# Patient Record
Sex: Male | Born: 1970 | Race: White | Hispanic: No | Marital: Single | Smoking: Former smoker
Health system: Southern US, Community
[De-identification: ages and names within clinical notes are randomized; demographics above are authoritative.]

## PROBLEM LIST (undated history)

## (undated) DIAGNOSIS — R42 Dizziness and giddiness: Secondary | ICD-10-CM

## (undated) DIAGNOSIS — J0301 Acute recurrent streptococcal tonsillitis: Secondary | ICD-10-CM

## (undated) DIAGNOSIS — Z8719 Personal history of other diseases of the digestive system: Secondary | ICD-10-CM

## (undated) DIAGNOSIS — Z9889 Other specified postprocedural states: Secondary | ICD-10-CM

## (undated) DIAGNOSIS — F17200 Nicotine dependence, unspecified, uncomplicated: Secondary | ICD-10-CM

## (undated) DIAGNOSIS — Z9089 Acquired absence of other organs: Secondary | ICD-10-CM

## (undated) HISTORY — DX: Personal history of other diseases of the digestive system: Z87.19

## (undated) HISTORY — DX: Nicotine dependence, unspecified, uncomplicated: F17.200

## (undated) HISTORY — DX: Acquired absence of other organs: Z90.89

## (undated) HISTORY — DX: Other specified postprocedural states: Z98.890

## (undated) HISTORY — DX: Dizziness and giddiness: R42

## (undated) HISTORY — DX: Acute recurrent streptococcal tonsillitis: J03.01

---

## 2004-12-15 ENCOUNTER — Emergency Department (HOSPITAL_COMMUNITY): Admission: EM | Admit: 2004-12-15 | Discharge: 2004-12-15 | Payer: Self-pay | Admitting: *Deleted

## 2010-01-05 HISTORY — PX: DENTAL SURGERY: SHX609

## 2011-11-05 ENCOUNTER — Encounter (INDEPENDENT_AMBULATORY_CARE_PROVIDER_SITE_OTHER): Payer: Self-pay | Admitting: General Surgery

## 2011-11-19 ENCOUNTER — Encounter (INDEPENDENT_AMBULATORY_CARE_PROVIDER_SITE_OTHER): Payer: Self-pay | Admitting: General Surgery

## 2011-11-19 ENCOUNTER — Telehealth (INDEPENDENT_AMBULATORY_CARE_PROVIDER_SITE_OTHER): Payer: Self-pay | Admitting: General Surgery

## 2011-11-19 ENCOUNTER — Ambulatory Visit (INDEPENDENT_AMBULATORY_CARE_PROVIDER_SITE_OTHER): Payer: PRIVATE HEALTH INSURANCE | Admitting: General Surgery

## 2011-11-19 VITALS — BP 125/64 | HR 80 | Temp 99.0°F | Resp 14 | Ht 70.0 in | Wt 147.8 lb

## 2011-11-19 DIAGNOSIS — K4090 Unilateral inguinal hernia, without obstruction or gangrene, not specified as recurrent: Secondary | ICD-10-CM

## 2011-11-19 MED ORDER — HYDROCODONE-ACETAMINOPHEN 10-325 MG PO TABS: 1.0000 | ORAL_TABLET | Freq: Every day | ORAL | Status: DC

## 2011-11-19 NOTE — Progress Notes (Signed)
Patient ID: Bryan Fox, male   DOB: February 25, 1970, 41 y.o.   MRN: [id number]  Chief Complaint  Patient presents with  . Hernia    HPI Bryan Fox is a 41 y.o. male.  With a 2 to 3-week history of a left inguinal hernia which occurred while at work.  Since his the patient had burning pain in his left inguinal area which is preventing him from working up with her work. Patient states the pain is greater  when standing for long periods of time and is relieved with the patient is lying down on his back. HPI  Past Medical History  Diagnosis Date  . Vertigo     Past Surgical History  Procedure Date  . Dental surgery 2012    cyst    History reviewed. No pertinent family history.  Social History History  Substance Use Topics  . Smoking status: Former Smoker    Quit date: 11/05/2006  . Smokeless tobacco: Not on file  . Alcohol Use: Yes     Comment: wine 2 per week    No Known Allergies  Current Outpatient Prescriptions  Medication Sig Dispense Refill  . hydrochlorothiazide (HYDRODIURIL) 25 MG tablet Take 25 mg by mouth daily.      Marland Kitchen ibuprofen (ADVIL,MOTRIN) 200 MG tablet Take 200 mg by mouth every 6 (six) hours as needed.      . meclizine (ANTIVERT) 25 MG tablet Take 25 mg by mouth 3 (three) times daily as needed.      . Multiple Vitamin (MULTIVITAMIN WITH MINERALS) TABS Take 1 tablet by mouth daily.      . traMADol (ULTRAM) 50 MG tablet         Review of Systems Review of Systems  Constitutional: Negative.   HENT: Negative.   Eyes: Negative.   Cardiovascular: Negative.   Gastrointestinal: Negative.   Neurological: Negative.     Blood pressure 125/64, pulse 80, temperature 99 F (37.2 C), temperature source Temporal, resp. rate 14, height 5\' 10"  (1.778 m), weight 147 lb 12.8 oz (67.042 kg).  Physical Exam Physical Exam  Constitutional: He is oriented to person, place, and time. He appears well-developed and well-nourished.  HENT:  Head: Normocephalic and  atraumatic.  Eyes: Conjunctivae normal are normal. Pupils are equal, round, and reactive to light.  Neck: Normal range of motion. Neck supple.  Cardiovascular: Normal rate and regular rhythm.   Pulmonary/Chest: Effort normal and breath sounds normal.  Abdominal: Soft. Bowel sounds are normal. Hernia confirmed negative in the left inguinal area.  Musculoskeletal: Normal range of motion.  Neurological: He is alert and oriented to person, place, and time.    Data Reviewed none  Assessment    The patient is a 41 year old male with a left likely direct inguinal hernia.    Plan    1. We'll proceed to the operating room for a laparoscopic left hernia repair. 2. All risks and benefits were discussed with the patient, to generally include infection, bleeding, damage to surrounding structures, and recurrence. Alternatives were offered and described.  All questions were answered and the patient voiced understanding of the procedure and wishes to proceed at this point.        Marigene Ehlers., Ysidra Sopher 11/19/2011, 9:29 AM

## 2011-11-19 NOTE — Telephone Encounter (Signed)
Pt noted that the hydrocodone on his paperwork from OV today is different from that filled at the pharmacy.  [facility] has hydrocodone 10/ 325 mg and his Rx was for 5/ 500 mg.  Pt instructed to use the dosage on the prescription and would let his Dr. Derrell Lolling know of the discrepancy.

## 2011-12-07 DIAGNOSIS — K4090 Unilateral inguinal hernia, without obstruction or gangrene, not specified as recurrent: Secondary | ICD-10-CM

## 2011-12-07 HISTORY — PX: HERNIA REPAIR: SHX51

## 2011-12-10 ENCOUNTER — Telehealth (INDEPENDENT_AMBULATORY_CARE_PROVIDER_SITE_OTHER): Payer: Self-pay | Admitting: General Surgery

## 2011-12-10 NOTE — Telephone Encounter (Signed)
Pt called for refill on pain meds.  Per standing orders, called Hydrocodone 5/325 mg,  # 30, 1-2 po Q 4-6 H prn pain, no refill to [facility]:  [phone].

## 2011-12-11 ENCOUNTER — Telehealth (INDEPENDENT_AMBULATORY_CARE_PROVIDER_SITE_OTHER): Payer: Self-pay

## 2011-12-11 NOTE — Telephone Encounter (Signed)
The pharmacy called back regarding the prescription called in yesterday.  They did not get the prescriber's name.  I told her it is Dr Derrell Lolling.

## 2011-12-22 ENCOUNTER — Encounter (INDEPENDENT_AMBULATORY_CARE_PROVIDER_SITE_OTHER): Payer: Self-pay | Admitting: General Surgery

## 2011-12-22 ENCOUNTER — Ambulatory Visit (INDEPENDENT_AMBULATORY_CARE_PROVIDER_SITE_OTHER): Payer: PRIVATE HEALTH INSURANCE | Admitting: General Surgery

## 2011-12-22 VITALS — BP 112/78 | HR 84 | Temp 97.6°F | Resp 18 | Ht 70.0 in | Wt 151.4 lb

## 2011-12-22 DIAGNOSIS — Z9889 Other specified postprocedural states: Secondary | ICD-10-CM

## 2011-12-22 DIAGNOSIS — Z8719 Personal history of other diseases of the digestive system: Secondary | ICD-10-CM

## 2011-12-22 NOTE — Progress Notes (Signed)
Patient ID: Bryan Fox, male   DOB: Mar 08, 1970, 41 y.o.   MRN: [id number] The patient is a 41 year old male status post left inguinal hernia repair with mesh patient has been doing well postoperatively aside from some soreness in his left testicle. Patient had some bruising which is resolved. Patient slowly started in his activity levels aerobically.  On exam Clean dry and intact There is no bruising to his left testicle There is no hernia on palpation  Assessment and plan: Patient be off work for 2 weeks. Patient will callback one week to reevaluate the possibility of return to work. Patient works as a Airline pilot.  Rx Lortab 5/325 1 or 2 tabs every 4 hours.

## 2012-02-01 ENCOUNTER — Ambulatory Visit (INDEPENDENT_AMBULATORY_CARE_PROVIDER_SITE_OTHER): Payer: PRIVATE HEALTH INSURANCE | Admitting: General Surgery

## 2012-02-01 ENCOUNTER — Encounter (INDEPENDENT_AMBULATORY_CARE_PROVIDER_SITE_OTHER): Payer: Self-pay | Admitting: General Surgery

## 2012-02-01 VITALS — BP 124/72 | HR 80 | Temp 98.2°F | Resp 14 | Ht 70.0 in | Wt 152.8 lb

## 2012-02-01 DIAGNOSIS — Z9889 Other specified postprocedural states: Secondary | ICD-10-CM

## 2012-02-01 NOTE — Progress Notes (Signed)
Patient ID: Bryan Fox, male   DOB: [birth year], 42 y.o.   MRN: [id number] The patient is a 42 year old male status post left inguinal hernia repair with mesh. Patient didn't do well postoperatively and his return back to work. Patient had some soreness but no symptomatology associated with left hernia recurrence.  On exam: The wound is clean dry and intact there is no hernia on palpation.   Assessment and plan: Patient can return to work at full duty Follow up when necessary

## 2015-09-02 ENCOUNTER — Encounter (INDEPENDENT_AMBULATORY_CARE_PROVIDER_SITE_OTHER): Payer: Self-pay | Admitting: *Deleted

## 2015-09-02 VITALS — BP 142/86 | HR 75 | Temp 98.4°F | Ht 70.0 in | Wt 155.0 lb

## 2015-09-02 DIAGNOSIS — Z9089 Acquired absence of other organs: Secondary | ICD-10-CM | POA: Insufficient documentation

## 2015-09-02 DIAGNOSIS — Z006 Encounter for examination for normal comparison and control in clinical research program: Secondary | ICD-10-CM

## 2015-09-02 DIAGNOSIS — Z8719 Personal history of other diseases of the digestive system: Secondary | ICD-10-CM | POA: Insufficient documentation

## 2015-09-02 DIAGNOSIS — F17200 Nicotine dependence, unspecified, uncomplicated: Secondary | ICD-10-CM | POA: Insufficient documentation

## 2015-09-02 DIAGNOSIS — Z720 Tobacco use: Secondary | ICD-10-CM

## 2015-09-02 DIAGNOSIS — J0300 Acute streptococcal tonsillitis, unspecified: Secondary | ICD-10-CM

## 2015-09-02 DIAGNOSIS — Z9889 Other specified postprocedural states: Secondary | ICD-10-CM | POA: Insufficient documentation

## 2015-09-02 DIAGNOSIS — J0301 Acute recurrent streptococcal tonsillitis: Secondary | ICD-10-CM

## 2015-09-02 HISTORY — DX: Acute recurrent streptococcal tonsillitis: J03.01

## 2015-09-02 HISTORY — DX: Nicotine dependence, unspecified, uncomplicated: F17.200

## 2015-09-02 HISTORY — DX: Personal history of other diseases of the digestive system: Z87.19

## 2015-09-02 HISTORY — DX: Acquired absence of other organs: Z90.89

## 2015-09-02 LAB — COMPREHENSIVE METABOLIC PANEL
ALBUMIN: 4.9 g/dL (ref 3.6–5.1)
ALK PHOS: 20 U/L — AB (ref 40–115)
ALT: 22 U/L (ref 9–46)
AST: 23 U/L (ref 10–40)
BILIRUBIN TOTAL: 0.5 mg/dL (ref 0.2–1.2)
BUN: 18 mg/dL (ref 7–25)
CALCIUM: 9.8 mg/dL (ref 8.6–10.3)
CO2: 24 mmol/L (ref 20–31)
Chloride: 102 mmol/L (ref 98–110)
Creat: 0.74 mg/dL (ref 0.60–1.35)
GLUCOSE: 83 mg/dL (ref 65–99)
POTASSIUM: 4 mmol/L (ref 3.5–5.3)
SODIUM: 139 mmol/L (ref 135–146)
Total Protein: 7.3 g/dL (ref 6.1–8.1)

## 2015-09-02 LAB — CBC WITH DIFFERENTIAL/PLATELET
Basophils Absolute: 55 cells/uL (ref 0–200)
Basophils Relative: 1 %
EOS PCT: 3 %
Eosinophils Absolute: 165 cells/uL (ref 15–500)
HCT: 46.1 % (ref 38.5–50.0)
HEMOGLOBIN: 15.2 g/dL (ref 13.2–17.1)
LYMPHS ABS: 2585 {cells}/uL (ref 850–3900)
Lymphocytes Relative: 47 %
MCH: 30 pg (ref 27.0–33.0)
MCHC: 33 g/dL (ref 32.0–36.0)
MCV: 90.9 fL (ref 80.0–100.0)
MPV: 9.9 fL (ref 7.5–12.5)
Monocytes Absolute: 495 cells/uL (ref 200–950)
Monocytes Relative: 9 %
NEUTROS PCT: 40 %
Neutro Abs: 2200 cells/uL (ref 1500–7800)
Platelets: 232 10*3/uL (ref 140–400)
RBC: 5.07 MIL/uL (ref 4.20–5.80)
RDW: 15.7 % — AB (ref 11.0–15.0)
WBC: 5.5 10*3/uL (ref 3.8–10.8)

## 2015-09-02 LAB — HIV ANTIBODY (ROUTINE TESTING W REFLEX): HIV: NONREACTIVE

## 2015-09-02 NOTE — Progress Notes (Addendum)
Study: A Phase 2b/3 Double Blind Safety and Efficacy Study of Injectable Cabotegravir compared to Daily Oral Tenofovir Disoproxil Fumarate/Emtricitabine (TDF/FTC), For Pre-Exposure Prophylaxis in HIV-Uninfected Cisgender Men and Transgender Women who have sex with Men.  Medication: Investigational Injectable Cabotegravir/placebo compared to Truvada/placebo. Duration: Around 4 years.  Bryan Fox is here for [id number] screening visit. After verifying the correct version I explained/reviewed the informed consent in the language that he understood. Risk, benefits, responsibilities, and other options were reviewed. I answered his questions. Comprehension was assessed. He was given adequate time to consider his options. He verbalized understanding and signed the consent witnessed by me. I then gave him a copy of the consent. He is eligible for screening since he has been sexually active with than 5 male at birth partners in the past 6 months and has also used poppers during those ocassions. HIV counseling was given including description of the testing and how it is done; explained HIV and how it is spread and ways to prevent it; Discussed the meaning of the possible test results and what impact the test results may have on the participant. PTID assigned. Confirmation of eligibility for screening was confirmed. Blood drawn and HIV rapid is negative. Medical history, medications, bleeding history, and signs/symptoms were reviewed. ECG and vitals were obtained. Complete PE performed. He received $50 gift card for screening visit. Will come in for HIV VL on 09/16/2015. Tacey HeapElisha Chanel Mcadams RN

## 2015-09-02 NOTE — Progress Notes (Signed)
Subjective:  Chief complaint: Here for CPE for HPTN 083   Patient ID: Bryan Fox, male    DOB: Feb 13, 1970, 45 y.o.   MRN: [id number]  HPI  45 year old Caucasian male who is here for a CPE exam for the PREP study HPTN 083. Bryan Fox heard about the study via friend who knew someone who worked at [employer]. He last tested negative for HIV proximally 6 months ago but has not been tested since then. He has no complaints and had no specific questions about the study though I did go again into details with regards to the drugs involved and how they're being administered.  Past Medical History:  Diagnosis Date  . H/O right inguinal hernia repair 09/02/2015  . Recurrent streptococcal tonsillitis 09/02/2015  . S/P tonsillectomy 09/02/2015  . Smoker 09/02/2015  . Vertigo     Past Surgical History:  Procedure Laterality Date  . DENTAL SURGERY  2012   cyst  . HERNIA REPAIR  12/07/2011   lih repair    Family History  Problem Relation Age of Onset  . Cancer Father     lung      Social History   Social History  . Marital status: Single    Spouse name: N/A  . Number of children: N/A  . Years of education: N/A   Social History Main Topics  . Smoking status: Former Smoker    Quit date: 11/05/2006  . Smokeless tobacco: None  . Alcohol use Yes     Comment: wine 2 per week  . Drug use: No  . Sexual activity: Not Asked   Other Topics Concern  . None   Social History Narrative  . None    No Known Allergies   Current Outpatient Prescriptions:  .  Ascorbic Acid (VITAMIN C) 250 MG CHEW, Chew 2 each by mouth daily., Disp: , Rfl:  .  ibuprofen (ADVIL,MOTRIN) 200 MG tablet, Take 200 mg by mouth every 6 (six) hours as needed., Disp: , Rfl:  .  meclizine (ANTIVERT) 25 MG tablet, Take 25 mg by mouth 3 (three) times daily as needed., Disp: , Rfl:  .  Multiple Vitamin (MULTIVITAMIN WITH MINERALS) TABS, Take 1 tablet by mouth daily., Disp: , Rfl:    Review of Systems    Constitutional: Negative for chills, diaphoresis and fever.  HENT: Negative for congestion, hearing loss, sore throat and tinnitus.   Respiratory: Negative for cough, shortness of breath and wheezing.   Cardiovascular: Negative for chest pain, palpitations and leg swelling.  Gastrointestinal: Negative for abdominal pain, blood in stool, constipation, diarrhea, nausea and vomiting.  Genitourinary: Negative for dysuria, flank pain and hematuria.  Musculoskeletal: Negative for back pain and myalgias.  Skin: Negative for rash.  Neurological: Negative for dizziness, weakness and headaches.  Hematological: Does not bruise/bleed easily.  Psychiatric/Behavioral: Negative for suicidal ideas. The patient is not nervous/anxious.        Objective:   Physical Exam  Constitutional: He is oriented to person, place, and time. He appears well-developed and well-nourished. No distress.  HENT:  Head: Normocephalic and atraumatic.  Right Ear: External ear normal.  Left Ear: External ear normal.  Nose: Nose normal.  Mouth/Throat: Oropharynx is clear and moist. No oropharyngeal exudate.  Eyes: Conjunctivae and EOM are normal. Pupils are equal, round, and reactive to light. Right eye exhibits no discharge. Left eye exhibits no discharge. No scleral icterus.  Neck: Normal range of motion. Neck supple. No JVD present. No tracheal deviation present. No  thyromegaly present.  Cardiovascular: Normal rate, regular rhythm and normal heart sounds.  Exam reveals no gallop and no friction rub.   No murmur heard. Pulmonary/Chest: Effort normal and breath sounds normal. No respiratory distress. He has no wheezes. He has no rales. He exhibits no tenderness.  Abdominal: Bowel sounds are normal. He exhibits no distension and no mass. There is no tenderness. There is no rebound and no guarding.  Musculoskeletal: Normal range of motion. He exhibits no edema, tenderness or deformity.  Lymphadenopathy:       Head (right  side): No submental, no submandibular, no tonsillar, no preauricular, no posterior auricular and no occipital adenopathy present.       Head (left side): No submental, no submandibular, no tonsillar, no preauricular and no occipital adenopathy present.    He has no cervical adenopathy.       Right cervical: No superficial cervical, no deep cervical and no posterior cervical adenopathy present.      Left cervical: No superficial cervical, no deep cervical and no posterior cervical adenopathy present.       Right: No supraclavicular adenopathy present.       Left: No supraclavicular adenopathy present.  Neurological: He is alert and oriented to person, place, and time. No cranial nerve deficit or sensory deficit. He exhibits normal muscle tone. Coordination normal.  Skin: Skin is warm and dry. No rash noted. He is not diaphoretic. No erythema. No pallor.  Psychiatric: He has a normal mood and affect. His behavior is normal. Judgment and thought content normal.          Assessment & Plan:   Normal CPE. His EKG showed Sinus bradycardia. He had J point elevation in V3, V4 c/w his body habitus (lower BMI) and non specific T w inversino in I and AVL. Provided his labs are OK he should be ready to enroll into HPTN 083.

## 2015-09-02 NOTE — Addendum Note (Signed)
Addended by: Nicolasa DuckingEPPERSON, Alanis Clift S on: 09/02/2015 02:35 PM   Modules accepted: Orders

## 2015-09-03 ENCOUNTER — Telehealth: Payer: Self-pay

## 2015-09-03 LAB — HEPATITIS B SURFACE ANTIGEN: HEP B S AG: NEGATIVE

## 2015-09-03 LAB — HEPATITIS C ANTIBODY: HCV AB: NEGATIVE

## 2015-09-16 ENCOUNTER — Encounter (INDEPENDENT_AMBULATORY_CARE_PROVIDER_SITE_OTHER): Payer: Self-pay | Admitting: *Deleted

## 2015-09-16 DIAGNOSIS — Z006 Encounter for examination for normal comparison and control in clinical research program: Secondary | ICD-10-CM

## 2015-09-16 NOTE — Progress Notes (Signed)
[name] is here for second part of screening. He had blood drawn to check HIV quant. Will check for eligibility and if qualifies he will enroll on 9/14. Tacey HeapElisha Epperson RN

## 2015-09-18 LAB — HIV-1 RNA QUANT-NO REFLEX-BLD: HIV 1 RNA Quant: <20 copies/mL (ref <20)

## 2015-09-30 ENCOUNTER — Encounter (INDEPENDENT_AMBULATORY_CARE_PROVIDER_SITE_OTHER): Payer: Self-pay | Admitting: *Deleted

## 2015-09-30 VITALS — BP 138/89 | HR 73 | Temp 98.0°F | Wt 156.1 lb

## 2015-09-30 DIAGNOSIS — Z006 Encounter for examination for normal comparison and control in clinical research program: Secondary | ICD-10-CM

## 2015-09-30 LAB — CBC WITH DIFFERENTIAL/PLATELET
BASOS ABS: 0 {cells}/uL (ref 0–200)
Basophils Relative: 0 %
Eosinophils Absolute: 159 cells/uL (ref 15–500)
Eosinophils Relative: 3 %
HEMATOCRIT: 45 % (ref 38.5–50.0)
HEMOGLOBIN: 14.8 g/dL (ref 13.2–17.1)
LYMPHS PCT: 60 %
Lymphs Abs: 3180 cells/uL (ref 850–3900)
MCH: 30 pg (ref 27.0–33.0)
MCHC: 32.9 g/dL (ref 32.0–36.0)
MCV: 91.1 fL (ref 80.0–100.0)
MONO ABS: 477 {cells}/uL (ref 200–950)
MPV: 9.8 fL (ref 7.5–12.5)
Monocytes Relative: 9 %
NEUTROS PCT: 28 %
Neutro Abs: 1484 cells/uL — ABNORMAL LOW (ref 1500–7800)
Platelets: 225 10*3/uL (ref 140–400)
RBC: 4.94 MIL/uL (ref 4.20–5.80)
RDW: 15.1 % — AB (ref 11.0–15.0)
WBC: 5.3 10*3/uL (ref 3.8–10.8)

## 2015-09-30 LAB — COMPREHENSIVE METABOLIC PANEL
ALBUMIN: 4.4 g/dL (ref 3.6–5.1)
ALT: 18 U/L (ref 9–46)
AST: 18 U/L (ref 10–40)
Alkaline Phosphatase: 17 U/L — ABNORMAL LOW (ref 40–115)
BUN: 17 mg/dL (ref 7–25)
CALCIUM: 9.7 mg/dL (ref 8.6–10.3)
CHLORIDE: 104 mmol/L (ref 98–110)
CO2: 26 mmol/L (ref 20–31)
Creat: 0.73 mg/dL (ref 0.60–1.35)
Glucose, Bld: 103 mg/dL — ABNORMAL HIGH (ref 65–99)
POTASSIUM: 4.5 mmol/L (ref 3.5–5.3)
SODIUM: 139 mmol/L (ref 135–146)
TOTAL PROTEIN: 6.7 g/dL (ref 6.1–8.1)
Total Bilirubin: 0.5 mg/dL (ref 0.2–1.2)

## 2015-09-30 LAB — POCT URINALYSIS DIPSTICK
BILIRUBIN UA: NEGATIVE
Blood, UA: NEGATIVE
GLUCOSE UA: NEGATIVE
KETONES UA: NEGATIVE
LEUKOCYTES UA: NEGATIVE
NITRITE UA: NEGATIVE
Protein, UA: NEGATIVE
Spec Grav, UA: 1.02
Urobilinogen, UA: 0.2
pH, UA: 6

## 2015-09-30 LAB — PHOSPHORUS: PHOSPHORUS: 3.6 mg/dL (ref 2.5–4.5)

## 2015-09-30 LAB — HEPATITIS B CORE ANTIBODY, TOTAL: HEP B C TOTAL AB: NONREACTIVE

## 2015-09-30 LAB — LIPID PANEL
Cholesterol: 181 mg/dL (ref 125–200)
HDL: 91 mg/dL (ref >=40)
LDL CALC: 78 mg/dL (ref <130)
Total CHOL/HDL Ratio: 2 Ratio (ref <=5.0)
Triglycerides: 61 mg/dL (ref <150)
VLDL: 12 mg/dL (ref <30)

## 2015-09-30 LAB — AMYLASE: AMYLASE: 28 U/L (ref 0–105)

## 2015-09-30 LAB — LIPASE: LIPASE: 9 U/L (ref 7–60)

## 2015-09-30 LAB — CK: Total CK: 195 U/L (ref 7–232)

## 2015-09-30 LAB — HIV ANTIBODY (ROUTINE TESTING W REFLEX): HIV: NONREACTIVE

## 2015-09-30 LAB — HEPATITIS B SURFACE ANTIBODY,QUALITATIVE: HEP B S AB: NEGATIVE

## 2015-10-01 ENCOUNTER — Encounter: Payer: Self-pay | Admitting: *Deleted

## 2015-10-01 LAB — GC/CHLAMYDIA PROBE AMP
CT PROBE, AMP APTIMA: NOT DETECTED
GC PROBE AMP APTIMA: NOT DETECTED

## 2015-10-01 LAB — RPR

## 2015-10-01 NOTE — Progress Notes (Signed)
Study: A Phase 2b/3 Double Blind Safety and Efficacy Study of Injectable Cabotegravir compared to Daily Oral Tenofovir Disoproxil Fumarate/Emtricitabine (TDF/FTC), For Pre-Exposure Prophylaxis in HIV-Uninfected Cisgender Men and Transgender Women who have sex with Men.  Medication: Investigational Injectable Cabotegravir/placebo compared to Truvada/placebo. Duration: Around 4 years.   Bryan Fox is here for entry visit. After confirming his willing to enroll I drew his blood. HIV rapid was confirmed to be negative and he was randomized to study. Assessment unchanged since last study visit. Vitals stable. Questionnaires completed. Study meds were dispensed. We discussed proper administration, potential side effects, and my contact information should he have any questions/concerns. He verbalized understanding. He received $50 gift card for visit and will see him in 2 weeks for safety check.

## 2015-10-04 LAB — CT/NG RNA, TMA RECTAL
CHLAMYDIA TRACHOMATIS RNA: NOT DETECTED
NEISSERIA GONORRHOEAE RNA: NOT DETECTED

## 2015-10-14 ENCOUNTER — Encounter (INDEPENDENT_AMBULATORY_CARE_PROVIDER_SITE_OTHER): Payer: Self-pay | Admitting: *Deleted

## 2015-10-14 VITALS — BP 134/89 | HR 69 | Temp 98.0°F | Wt 156.5 lb

## 2015-10-14 DIAGNOSIS — Z006 Encounter for examination for normal comparison and control in clinical research program: Secondary | ICD-10-CM

## 2015-10-14 LAB — COMPREHENSIVE METABOLIC PANEL
ALBUMIN: 4.5 g/dL (ref 3.6–5.1)
ALK PHOS: 18 U/L — AB (ref 40–115)
ALT: 20 U/L (ref 9–46)
AST: 20 U/L (ref 10–40)
BUN: 15 mg/dL (ref 7–25)
CO2: 24 mmol/L (ref 20–31)
CREATININE: 0.64 mg/dL (ref 0.60–1.35)
Calcium: 9.8 mg/dL (ref 8.6–10.3)
Chloride: 102 mmol/L (ref 98–110)
Glucose, Bld: 80 mg/dL (ref 65–99)
POTASSIUM: 4 mmol/L (ref 3.5–5.3)
SODIUM: 138 mmol/L (ref 135–146)
TOTAL PROTEIN: 7.1 g/dL (ref 6.1–8.1)
Total Bilirubin: 0.9 mg/dL (ref 0.2–1.2)

## 2015-10-14 LAB — CBC WITH DIFFERENTIAL/PLATELET
BASOS ABS: 59 {cells}/uL (ref 0–200)
Basophils Relative: 1 %
EOS ABS: 177 {cells}/uL (ref 15–500)
Eosinophils Relative: 3 %
HEMATOCRIT: 43.6 % (ref 38.5–50.0)
HEMOGLOBIN: 14.3 g/dL (ref 13.2–17.1)
LYMPHS ABS: 2655 {cells}/uL (ref 850–3900)
Lymphocytes Relative: 45 %
MCH: 29.9 pg (ref 27.0–33.0)
MCHC: 32.8 g/dL (ref 32.0–36.0)
MCV: 91.2 fL (ref 80.0–100.0)
MONO ABS: 531 {cells}/uL (ref 200–950)
MPV: 9.4 fL (ref 7.5–12.5)
Monocytes Relative: 9 %
NEUTROS PCT: 42 %
Neutro Abs: 2478 cells/uL (ref 1500–7800)
Platelets: 242 10*3/uL (ref 140–400)
RBC: 4.78 MIL/uL (ref 4.20–5.80)
RDW: 14.8 % (ref 11.0–15.0)
WBC: 5.9 10*3/uL (ref 3.8–10.8)

## 2015-10-14 LAB — PHOSPHORUS: PHOSPHORUS: 3 mg/dL (ref 2.5–4.5)

## 2015-10-14 LAB — CK: CK TOTAL: 181 U/L (ref 7–232)

## 2015-10-14 LAB — LIPASE: Lipase: 7 U/L (ref 7–60)

## 2015-10-14 LAB — AMYLASE: Amylase: 24 U/L (ref 0–105)

## 2015-10-14 NOTE — Progress Notes (Signed)
Study: A Phase 2b/3 Double Blind Safety and Efficacy Study of Injectable Cabotegravir compared to Daily Oral Tenofovir Disoproxil Fumarate/Emtricitabine (TDF/FTC), For Pre-Exposure Prophylaxis in HIV-Uninfected Cisgender Men and Transgender Women who have sex with Men.  Medication: Investigational Injectable Cabotegravir/placebo compared to Truvada/placebo. Duration: Around 4 years.  Bryan Fox is here for his week 2 visit. Excellent adherence with his medication. Pill count done, #45 remaining of both medications. Denies any missed doses. States that he did experience mild GI upset (nausea, loose stools) after starting study medication but this has since resolved. Has noted some vivid dreams since starting on his medication as well. No other complaints or concerns verbalized. Next visit scheduled for 10/23 @ 10:30am.

## 2015-10-15 LAB — HIV ANTIBODY (ROUTINE TESTING W REFLEX): HIV: NONREACTIVE

## 2015-10-28 ENCOUNTER — Encounter (INDEPENDENT_AMBULATORY_CARE_PROVIDER_SITE_OTHER): Payer: Self-pay | Admitting: *Deleted

## 2015-10-28 VITALS — BP 126/84 | HR 77 | Temp 97.9°F | Wt 156.0 lb

## 2015-10-28 DIAGNOSIS — Z006 Encounter for examination for normal comparison and control in clinical research program: Secondary | ICD-10-CM

## 2015-10-28 LAB — COMPREHENSIVE METABOLIC PANEL
ALT: 21 U/L (ref 9–46)
AST: 22 U/L (ref 10–40)
Albumin: 4.3 g/dL (ref 3.6–5.1)
Alkaline Phosphatase: 23 U/L — ABNORMAL LOW (ref 40–115)
BUN: 14 mg/dL (ref 7–25)
CHLORIDE: 106 mmol/L (ref 98–110)
CO2: 27 mmol/L (ref 20–31)
CREATININE: 0.72 mg/dL (ref 0.60–1.35)
Calcium: 9.8 mg/dL (ref 8.6–10.3)
Glucose, Bld: 112 mg/dL — ABNORMAL HIGH (ref 65–99)
Potassium: 4.2 mmol/L (ref 3.5–5.3)
SODIUM: 140 mmol/L (ref 135–146)
Total Bilirubin: 0.5 mg/dL (ref 0.2–1.2)
Total Protein: 6.9 g/dL (ref 6.1–8.1)

## 2015-10-28 LAB — CBC WITH DIFFERENTIAL/PLATELET
BASOS PCT: 1 %
Basophils Absolute: 55 cells/uL (ref 0–200)
EOS ABS: 110 {cells}/uL (ref 15–500)
Eosinophils Relative: 2 %
HEMATOCRIT: 44.2 % (ref 38.5–50.0)
HEMOGLOBIN: 14.4 g/dL (ref 13.2–17.1)
LYMPHS ABS: 2420 {cells}/uL (ref 850–3900)
LYMPHS PCT: 44 %
MCH: 30.1 pg (ref 27.0–33.0)
MCHC: 32.6 g/dL (ref 32.0–36.0)
MCV: 92.5 fL (ref 80.0–100.0)
MONO ABS: 495 {cells}/uL (ref 200–950)
MPV: 10 fL (ref 7.5–12.5)
Monocytes Relative: 9 %
NEUTROS PCT: 44 %
Neutro Abs: 2420 cells/uL (ref 1500–7800)
Platelets: 214 10*3/uL (ref 140–400)
RBC: 4.78 MIL/uL (ref 4.20–5.80)
RDW: 14.6 % (ref 11.0–15.0)
WBC: 5.5 10*3/uL (ref 3.8–10.8)

## 2015-10-28 LAB — AMYLASE: Amylase: 24 U/L (ref 0–105)

## 2015-10-28 LAB — PHOSPHORUS: PHOSPHORUS: 3.5 mg/dL (ref 2.5–4.5)

## 2015-10-28 LAB — LIPASE: Lipase: 13 U/L (ref 7–60)

## 2015-10-28 LAB — CK: CK TOTAL: 133 U/L (ref 7–232)

## 2015-10-28 LAB — HIV ANTIBODY (ROUTINE TESTING W REFLEX): HIV: NONREACTIVE

## 2015-10-28 NOTE — Progress Notes (Signed)
Study: A Phase 2b/3 Double Blind Safety and Efficacy Study of Injectable Cabotegravir compared to Daily Oral Tenofovir Disoproxil Fumarate/Emtricitabine (TDF/FTC), For Pre-Exposure Prophylaxis in HIV-Uninfected Cisgender Men and Transgender Women who have sex with Men.  Medication: Investigational Injectable Cabotegravir/placebo compared to Truvada/placebo. Duration: Around 4 years.  Bryan Fox is here for week 4 visit. No new complaints or concerns verbalized. Excellent adherence with study medication. No missed doses. Pill count done, # 31 remaining of both study medication (100% compliance). States that he was seen by his PCP on 10/13 and started the Hep A and Hep B vaccine series and also received his influenza vaccine. He is scheduled to return next week for his 1st cabotegravir/placebo injection.

## 2015-11-04 ENCOUNTER — Encounter: Payer: Self-pay | Admitting: *Deleted

## 2015-11-04 ENCOUNTER — Encounter (INDEPENDENT_AMBULATORY_CARE_PROVIDER_SITE_OTHER): Payer: Self-pay | Admitting: *Deleted

## 2015-11-04 VITALS — BP 127/79 | HR 80 | Temp 98.0°F | Wt 156.0 lb

## 2015-11-04 DIAGNOSIS — Z006 Encounter for examination for normal comparison and control in clinical research program: Secondary | ICD-10-CM

## 2015-11-04 NOTE — Progress Notes (Signed)
Study: A Phase 2b/3 Double Blind Safety and Efficacy Study of Injectable Cabotegravir compared to Daily Oral Tenofovir Disoproxil Fumarate/Emtricitabine (TDF/FTC), For Pre-Exposure Prophylaxis in HIV-Uninfected Cisgender Men and Transgender Women who have sex with Men.  Medication: Investigational Injectable Cabotegravir/placebo compared to Truvada/placebo. Duration: Around 4 years.  [name] is here for week 5.Assessment unchanged since last study visit. HIV rapid is negative. Return study drug: #24 of each. Adherence perfect. Questionnaires complete. Injection given in left gluteal muscle with no problems. Oral study IP dispensed. He received $50 gift card for visit. Next appointment scheduled for 11/6 @ 1030. Tacey HeapElisha Christee Mervine RN

## 2015-11-05 LAB — HIV ANTIBODY (ROUTINE TESTING W REFLEX): HIV: NONREACTIVE

## 2015-11-11 ENCOUNTER — Encounter (INDEPENDENT_AMBULATORY_CARE_PROVIDER_SITE_OTHER): Payer: Self-pay | Admitting: *Deleted

## 2015-11-11 VITALS — BP 128/81 | HR 71 | Temp 98.0°F | Wt 151.7 lb

## 2015-11-11 DIAGNOSIS — Z006 Encounter for examination for normal comparison and control in clinical research program: Secondary | ICD-10-CM

## 2015-11-11 LAB — CBC WITH DIFFERENTIAL/PLATELET
BASOS PCT: 1 %
Basophils Absolute: 57 cells/uL (ref 0–200)
EOS ABS: 171 {cells}/uL (ref 15–500)
EOS PCT: 3 %
HCT: 44.5 % (ref 38.5–50.0)
Hemoglobin: 14.7 g/dL (ref 13.2–17.1)
LYMPHS PCT: 47 %
Lymphs Abs: 2679 cells/uL (ref 850–3900)
MCH: 30.4 pg (ref 27.0–33.0)
MCHC: 33 g/dL (ref 32.0–36.0)
MCV: 92.1 fL (ref 80.0–100.0)
MONOS PCT: 8 %
MPV: 9.8 fL (ref 7.5–12.5)
Monocytes Absolute: 456 cells/uL (ref 200–950)
NEUTROS ABS: 2337 {cells}/uL (ref 1500–7800)
Neutrophils Relative %: 41 %
PLATELETS: 259 10*3/uL (ref 140–400)
RBC: 4.83 MIL/uL (ref 4.20–5.80)
RDW: 14.2 % (ref 11.0–15.0)
WBC: 5.7 10*3/uL (ref 3.8–10.8)

## 2015-11-11 LAB — CK: CK TOTAL: 125 U/L (ref 7–232)

## 2015-11-11 LAB — COMPREHENSIVE METABOLIC PANEL
ALK PHOS: 19 U/L — AB (ref 40–115)
ALT: 20 U/L (ref 9–46)
AST: 20 U/L (ref 10–40)
Albumin: 4.5 g/dL (ref 3.6–5.1)
BILIRUBIN TOTAL: 0.7 mg/dL (ref 0.2–1.2)
BUN: 17 mg/dL (ref 7–25)
CO2: 26 mmol/L (ref 20–31)
Calcium: 10 mg/dL (ref 8.6–10.3)
Chloride: 101 mmol/L (ref 98–110)
Creat: 0.72 mg/dL (ref 0.60–1.35)
GLUCOSE: 82 mg/dL (ref 65–99)
Potassium: 4.2 mmol/L (ref 3.5–5.3)
SODIUM: 138 mmol/L (ref 135–146)
Total Protein: 7.1 g/dL (ref 6.1–8.1)

## 2015-11-11 LAB — LIPASE: Lipase: 8 U/L (ref 7–60)

## 2015-11-11 LAB — PHOSPHORUS: Phosphorus: 3.6 mg/dL (ref 2.5–4.5)

## 2015-11-11 LAB — AMYLASE: AMYLASE: 28 U/L (ref 0–105)

## 2015-11-12 ENCOUNTER — Encounter: Payer: Self-pay | Admitting: *Deleted

## 2015-11-12 LAB — HIV ANTIBODY (ROUTINE TESTING W REFLEX): HIV 1&2 Ab, 4th Generation: NONREACTIVE

## 2015-11-12 NOTE — Progress Notes (Signed)
Study: A Phase 2b/3 Double Blind Safety and Efficacy Study of Injectable Cabotegravir compared to Daily Oral Tenofovir Disoproxil Fumarate/Emtricitabine (TDF/FTC), For Pre-Exposure Prophylaxis in HIV-Uninfected Cisgender Men and Transgender Women who have sex with Men.  Medication: Investigational Injectable Cabotegravir/placebo compared to Truvada/placebo. Duration: Around 4 years.  Bryan Fox is here for week 6. He had 2 days of tenderness at the injection site. No other issues. He received $50 gift card for visit. Will see in 3 weeks for 2nd injections. Tacey HeapElisha Tehila Sokolow RN

## 2015-12-02 ENCOUNTER — Encounter (INDEPENDENT_AMBULATORY_CARE_PROVIDER_SITE_OTHER): Payer: Self-pay | Admitting: *Deleted

## 2015-12-02 VITALS — BP 132/83 | HR 76 | Temp 97.9°F | Wt 154.5 lb

## 2015-12-02 DIAGNOSIS — Z006 Encounter for examination for normal comparison and control in clinical research program: Secondary | ICD-10-CM

## 2015-12-02 LAB — CBC WITH DIFFERENTIAL/PLATELET
BASOS ABS: 42 {cells}/uL (ref 0–200)
Basophils Relative: 1 %
EOS ABS: 126 {cells}/uL (ref 15–500)
Eosinophils Relative: 3 %
HEMATOCRIT: 45.2 % (ref 38.5–50.0)
HEMOGLOBIN: 14.8 g/dL (ref 13.2–17.1)
LYMPHS ABS: 2520 {cells}/uL (ref 850–3900)
Lymphocytes Relative: 60 %
MCH: 30 pg (ref 27.0–33.0)
MCHC: 32.7 g/dL (ref 32.0–36.0)
MCV: 91.7 fL (ref 80.0–100.0)
MONO ABS: 336 {cells}/uL (ref 200–950)
MPV: 9.4 fL (ref 7.5–12.5)
Monocytes Relative: 8 %
NEUTROS ABS: 1176 {cells}/uL — AB (ref 1500–7800)
Neutrophils Relative %: 28 %
Platelets: 225 10*3/uL (ref 140–400)
RBC: 4.93 MIL/uL (ref 4.20–5.80)
RDW: 14.5 % (ref 11.0–15.0)
WBC: 4.2 10*3/uL (ref 3.8–10.8)

## 2015-12-02 NOTE — Progress Notes (Signed)
Study: A Phase 2b/3 Double Blind Safety and Efficacy Study of Injectable Cabotegravir compared to Daily Oral Tenofovir Disoproxil Fumarate/Emtricitabine (TDF/FTC), For Pre-Exposure Prophylaxis in HIV-Uninfected Cisgender Men and Transgender Women who have sex with Men.  Medication: Investigational Injectable Cabotegravir/placebo compared to Truvada/placebo. Duration: Around 4 years.  [name] is here for week 9 visit. No new complaints or concerns verbalized. Excellent adherence with his study medication. Rapid HIV non-reactive. Cabotegravir/placebo injection given (R) gluteal muscle. Site unremarkable. Next visit scheduled for 12/09/15 @ 2:00pm.

## 2015-12-03 LAB — COMPREHENSIVE METABOLIC PANEL
ALBUMIN: 4.5 g/dL (ref 3.6–5.1)
ALK PHOS: 20 U/L — AB (ref 40–115)
ALT: 21 U/L (ref 9–46)
AST: 24 U/L (ref 10–40)
BILIRUBIN TOTAL: 0.5 mg/dL (ref 0.2–1.2)
BUN: 11 mg/dL (ref 7–25)
CO2: 27 mmol/L (ref 20–31)
CREATININE: 0.67 mg/dL (ref 0.60–1.35)
Calcium: 9.4 mg/dL (ref 8.6–10.3)
Chloride: 103 mmol/L (ref 98–110)
Glucose, Bld: 88 mg/dL (ref 65–99)
Potassium: 4.4 mmol/L (ref 3.5–5.3)
SODIUM: 138 mmol/L (ref 135–146)
TOTAL PROTEIN: 7 g/dL (ref 6.1–8.1)

## 2015-12-03 LAB — PHOSPHORUS: Phosphorus: 3.5 mg/dL (ref 2.5–4.5)

## 2015-12-03 LAB — LIPASE: Lipase: 10 U/L (ref 7–60)

## 2015-12-03 LAB — HIV ANTIBODY (ROUTINE TESTING W REFLEX): HIV 1&2 Ab, 4th Generation: NONREACTIVE

## 2015-12-03 LAB — AMYLASE: AMYLASE: 25 U/L (ref 0–105)

## 2015-12-03 LAB — CK: Total CK: 114 U/L (ref 7–232)

## 2015-12-09 ENCOUNTER — Encounter (INDEPENDENT_AMBULATORY_CARE_PROVIDER_SITE_OTHER): Payer: Self-pay | Admitting: *Deleted

## 2015-12-09 VITALS — BP 126/86 | HR 73 | Temp 98.0°F | Wt 154.0 lb

## 2015-12-09 DIAGNOSIS — Z006 Encounter for examination for normal comparison and control in clinical research program: Secondary | ICD-10-CM

## 2015-12-09 LAB — CBC WITH DIFFERENTIAL/PLATELET
BASOS PCT: 0 %
Basophils Absolute: 0 cells/uL (ref 0–200)
EOS ABS: 100 {cells}/uL (ref 15–500)
EOS PCT: 2 %
HCT: 45.1 % (ref 38.5–50.0)
Hemoglobin: 14.8 g/dL (ref 13.2–17.1)
LYMPHS PCT: 47 %
Lymphs Abs: 2350 cells/uL (ref 850–3900)
MCH: 29.9 pg (ref 27.0–33.0)
MCHC: 32.8 g/dL (ref 32.0–36.0)
MCV: 91.1 fL (ref 80.0–100.0)
MONOS PCT: 8 %
MPV: 9.6 fL (ref 7.5–12.5)
Monocytes Absolute: 400 cells/uL (ref 200–950)
NEUTROS ABS: 2150 {cells}/uL (ref 1500–7800)
Neutrophils Relative %: 43 %
PLATELETS: 231 10*3/uL (ref 140–400)
RBC: 4.95 MIL/uL (ref 4.20–5.80)
RDW: 14.3 % (ref 11.0–15.0)
WBC: 5 10*3/uL (ref 3.8–10.8)

## 2015-12-09 NOTE — Progress Notes (Signed)
Study: A Phase 2b/3 Double Blind Safety and Efficacy Study of Injectable Cabotegravir compared to Daily Oral Tenofovir Disoproxil Fumarate/Emtricitabine (TDF/FTC), For Pre-Exposure Prophylaxis in HIV-Uninfected Cisgender Men and Transgender Women who have sex with Men.  Medication: Investigational Injectable Cabotegravir/placebo compared to Truvada/placebo. Duration: Around 4 years.  [name] is here for safety check week 10. He had 2 days of mild  injection site tenderness. He reports no other issues. Did not need to medicate for reaction. Interview questionnaire completed. He reports adherence to study medication. HIV rapid is negative. He received $50 gift card for visit. Next appointment scheduled for Monday, 12/17 at 10am. Tacey HeapElisha Epperson RN

## 2015-12-10 LAB — COMPREHENSIVE METABOLIC PANEL
ALK PHOS: 21 U/L — AB (ref 40–115)
ALT: 17 U/L (ref 9–46)
AST: 19 U/L (ref 10–40)
Albumin: 4.3 g/dL (ref 3.6–5.1)
BUN: 16 mg/dL (ref 7–25)
CALCIUM: 9.6 mg/dL (ref 8.6–10.3)
CO2: 27 mmol/L (ref 20–31)
Chloride: 104 mmol/L (ref 98–110)
Creat: 0.67 mg/dL (ref 0.60–1.35)
GLUCOSE: 93 mg/dL (ref 65–99)
POTASSIUM: 4.1 mmol/L (ref 3.5–5.3)
Sodium: 138 mmol/L (ref 135–146)
Total Bilirubin: 0.5 mg/dL (ref 0.2–1.2)
Total Protein: 6.6 g/dL (ref 6.1–8.1)

## 2015-12-10 LAB — LIPASE: Lipase: 13 U/L (ref 7–60)

## 2015-12-10 LAB — PHOSPHORUS: PHOSPHORUS: 4 mg/dL (ref 2.5–4.5)

## 2015-12-10 LAB — AMYLASE: Amylase: 30 U/L (ref 0–105)

## 2015-12-10 LAB — HIV ANTIBODY (ROUTINE TESTING W REFLEX): HIV 1&2 Ab, 4th Generation: NONREACTIVE

## 2015-12-10 LAB — CK: CK TOTAL: 169 U/L (ref 7–232)

## 2016-02-10 ENCOUNTER — Encounter (INDEPENDENT_AMBULATORY_CARE_PROVIDER_SITE_OTHER): Payer: Self-pay | Admitting: *Deleted

## 2016-02-10 VITALS — BP 130/83 | HR 78 | Temp 98.0°F | Wt 158.0 lb

## 2016-02-10 DIAGNOSIS — Z006 Encounter for examination for normal comparison and control in clinical research program: Secondary | ICD-10-CM

## 2016-02-10 LAB — CBC WITH DIFFERENTIAL/PLATELET
BASOS ABS: 61 {cells}/uL (ref 0–200)
BASOS PCT: 1 %
EOS ABS: 122 {cells}/uL (ref 15–500)
Eosinophils Relative: 2 %
HCT: 43.3 % (ref 38.5–50.0)
HEMOGLOBIN: 14.3 g/dL (ref 13.2–17.1)
LYMPHS ABS: 3294 {cells}/uL (ref 850–3900)
Lymphocytes Relative: 54 %
MCH: 30 pg (ref 27.0–33.0)
MCHC: 33 g/dL (ref 32.0–36.0)
MCV: 91 fL (ref 80.0–100.0)
MONO ABS: 488 {cells}/uL (ref 200–950)
MPV: 9.5 fL (ref 7.5–12.5)
Monocytes Relative: 8 %
NEUTROS ABS: 2135 {cells}/uL (ref 1500–7800)
Neutrophils Relative %: 35 %
Platelets: 220 10*3/uL (ref 140–400)
RBC: 4.76 MIL/uL (ref 4.20–5.80)
RDW: 14.8 % (ref 11.0–15.0)
WBC: 6.1 10*3/uL (ref 3.8–10.8)

## 2016-02-10 LAB — CK: Total CK: 126 U/L (ref 7–232)

## 2016-02-10 LAB — LIPASE: Lipase: 19 U/L (ref 7–60)

## 2016-02-10 LAB — AMYLASE: AMYLASE: 32 U/L (ref 0–105)

## 2016-02-10 MED ORDER — LOPERAMIDE HCL 2 MG PO TABS: 2.0000 mg | ORAL_TABLET | Freq: Three times a day (TID) | ORAL | 0 refills | Status: AC | PRN

## 2016-02-10 MED ORDER — HYDROCORTISONE ACE-PRAMOXINE 1-1 % RE FOAM: 1.0000 | Freq: Three times a day (TID) | RECTAL | 0 refills | Status: AC | PRN

## 2016-02-10 NOTE — Progress Notes (Signed)
Study: A Phase 2b/3 Double Blind Safety and Efficacy Study of Injectable Cabotegravir compared to Daily Oral Tenofovir Disoproxil Fumarate/Emtricitabine (TDF/FTC), For Pre-Exposure Prophylaxis in HIV-Uninfected Cisgender Men and Transgender Women who have sex with Men.  Medication: Investigational Injectable Cabotegravir/placebo compared to Truvada/placebo. Duration: Around 4 years.  Bryan Fox is here for week 17. He complains of short period of nausea followed by loose stools after taking oral study drug. Symptoms resolved a few hours after taking medication. These symptoms started in September 2017. 2 weeks ago he developed a hemorrhoid that is itchy and painful when he has loose stools. Spoke with Dr. Daiva EvesVan Dam and has prescribed proctofoam and imodium. HIV rapid was confirmed negative. Questionnaires completed. Vitals stable. Returned  #20 of oral study products and was dispensed #90. Injection given into left gluteal muscle. He received $50 gift card and declined condoms. Will see him next Monday for safety visit.

## 2016-02-11 LAB — COMPREHENSIVE METABOLIC PANEL
ALK PHOS: 20 U/L — AB (ref 40–115)
ALT: 18 U/L (ref 9–46)
AST: 18 U/L (ref 10–40)
Albumin: 4.4 g/dL (ref 3.6–5.1)
BILIRUBIN TOTAL: 0.4 mg/dL (ref 0.2–1.2)
BUN: 23 mg/dL (ref 7–25)
CALCIUM: 9.6 mg/dL (ref 8.6–10.3)
CO2: 24 mmol/L (ref 20–31)
Chloride: 103 mmol/L (ref 98–110)
Creat: 0.72 mg/dL (ref 0.60–1.35)
GLUCOSE: 103 mg/dL — AB (ref 65–99)
POTASSIUM: 4.3 mmol/L (ref 3.5–5.3)
Sodium: 137 mmol/L (ref 135–146)
TOTAL PROTEIN: 7 g/dL (ref 6.1–8.1)

## 2016-02-11 LAB — PHOSPHORUS: PHOSPHORUS: 3.8 mg/dL (ref 2.5–4.5)

## 2016-02-11 LAB — HIV ANTIBODY (ROUTINE TESTING W REFLEX): HIV: NONREACTIVE

## 2016-02-17 ENCOUNTER — Encounter (INDEPENDENT_AMBULATORY_CARE_PROVIDER_SITE_OTHER): Payer: Self-pay | Admitting: *Deleted

## 2016-02-17 VITALS — BP 113/77 | HR 80 | Temp 98.3°F | Wt 156.5 lb

## 2016-02-17 DIAGNOSIS — Z006 Encounter for examination for normal comparison and control in clinical research program: Secondary | ICD-10-CM

## 2016-02-17 LAB — COMPREHENSIVE METABOLIC PANEL
ALT: 19 U/L (ref 9–46)
AST: 18 U/L (ref 10–40)
Albumin: 4.2 g/dL (ref 3.6–5.1)
Alkaline Phosphatase: 19 U/L — ABNORMAL LOW (ref 40–115)
BILIRUBIN TOTAL: 0.5 mg/dL (ref 0.2–1.2)
BUN: 15 mg/dL (ref 7–25)
CO2: 27 mmol/L (ref 20–31)
CREATININE: 0.7 mg/dL (ref 0.60–1.35)
Calcium: 9.7 mg/dL (ref 8.6–10.3)
Chloride: 106 mmol/L (ref 98–110)
GLUCOSE: 97 mg/dL (ref 65–99)
Potassium: 4.6 mmol/L (ref 3.5–5.3)
SODIUM: 139 mmol/L (ref 135–146)
Total Protein: 6.7 g/dL (ref 6.1–8.1)

## 2016-02-17 LAB — PHOSPHORUS: Phosphorus: 3.7 mg/dL (ref 2.5–4.5)

## 2016-02-17 LAB — CBC WITH DIFFERENTIAL/PLATELET
BASOS PCT: 1 %
Basophils Absolute: 58 cells/uL (ref 0–200)
EOS ABS: 116 {cells}/uL (ref 15–500)
EOS PCT: 2 %
HCT: 43.7 % (ref 38.5–50.0)
Hemoglobin: 14.3 g/dL (ref 13.2–17.1)
LYMPHS PCT: 57 %
Lymphs Abs: 3306 cells/uL (ref 850–3900)
MCH: 29.9 pg (ref 27.0–33.0)
MCHC: 32.7 g/dL (ref 32.0–36.0)
MCV: 91.2 fL (ref 80.0–100.0)
MONOS PCT: 9 %
MPV: 9.5 fL (ref 7.5–12.5)
Monocytes Absolute: 522 cells/uL (ref 200–950)
NEUTROS ABS: 1798 {cells}/uL (ref 1500–7800)
Neutrophils Relative %: 31 %
PLATELETS: 235 10*3/uL (ref 140–400)
RBC: 4.79 MIL/uL (ref 4.20–5.80)
RDW: 14.9 % (ref 11.0–15.0)
WBC: 5.8 10*3/uL (ref 3.8–10.8)

## 2016-02-17 LAB — AMYLASE: Amylase: 28 U/L (ref 0–105)

## 2016-02-17 LAB — CK: CK TOTAL: 230 U/L (ref 7–232)

## 2016-02-17 LAB — LIPASE: Lipase: 15 U/L (ref 7–60)

## 2016-02-17 NOTE — Progress Notes (Signed)
Bryan Fox is here for his week 19 study visit for Study: A Phase 2b/3 Double Blind Safety and Efficacy Study of Injectable Cabotegravir compared to Daily Oral Tenofovir Disoproxil Fumarate/Emtricitabine (TDF/FTC), For Pre-Exposure Prophylaxis in HIV-Uninfected Cisgender Men and Transgender Women who have sex with Men.  Medication: Investigational Injectable Cabotegravir/placebo compared to Truvada/placebo. Duration: Around 4 years.  He denies any problem from the last injection a week ago. He continues to have occassional nausea and loose stools when he doesn't eat something when he takes his study meds (pill). He denies any other problems and doesn't believe he has had any risky contacts lately. He will return on March 19th for the next injection. He says he never misses taking his medication and takes them in the morning.

## 2016-02-18 LAB — HIV ANTIBODY (ROUTINE TESTING W REFLEX): HIV 1&2 Ab, 4th Generation: NONREACTIVE

## 2016-03-23 ENCOUNTER — Encounter (INDEPENDENT_AMBULATORY_CARE_PROVIDER_SITE_OTHER): Payer: Self-pay | Admitting: *Deleted

## 2016-03-23 VITALS — BP 131/82 | HR 101 | Temp 98.0°F | Wt 155.2 lb

## 2016-03-23 DIAGNOSIS — Z006 Encounter for examination for normal comparison and control in clinical research program: Secondary | ICD-10-CM

## 2016-03-23 LAB — COMPREHENSIVE METABOLIC PANEL
ALT: 18 U/L (ref 9–46)
AST: 20 U/L (ref 10–40)
Albumin: 4.3 g/dL (ref 3.6–5.1)
Alkaline Phosphatase: 24 U/L — ABNORMAL LOW (ref 40–115)
BUN: 21 mg/dL (ref 7–25)
CHLORIDE: 104 mmol/L (ref 98–110)
CO2: 27 mmol/L (ref 20–31)
CREATININE: 0.76 mg/dL (ref 0.60–1.35)
Calcium: 10.1 mg/dL (ref 8.6–10.3)
GLUCOSE: 107 mg/dL — AB (ref 65–99)
Potassium: 4.3 mmol/L (ref 3.5–5.3)
SODIUM: 138 mmol/L (ref 135–146)
TOTAL PROTEIN: 6.7 g/dL (ref 6.1–8.1)
Total Bilirubin: 0.3 mg/dL (ref 0.2–1.2)

## 2016-03-23 LAB — CBC WITH DIFFERENTIAL/PLATELET
BASOS ABS: 51 {cells}/uL (ref 0–200)
Basophils Relative: 1 %
EOS ABS: 153 {cells}/uL (ref 15–500)
EOS PCT: 3 %
HCT: 44.6 % (ref 38.5–50.0)
HEMOGLOBIN: 14.5 g/dL (ref 13.2–17.1)
LYMPHS ABS: 2703 {cells}/uL (ref 850–3900)
Lymphocytes Relative: 53 %
MCH: 30.1 pg (ref 27.0–33.0)
MCHC: 32.5 g/dL (ref 32.0–36.0)
MCV: 92.5 fL (ref 80.0–100.0)
MONOS PCT: 8 %
MPV: 9.5 fL (ref 7.5–12.5)
Monocytes Absolute: 408 cells/uL (ref 200–950)
NEUTROS PCT: 35 %
Neutro Abs: 1785 cells/uL (ref 1500–7800)
PLATELETS: 238 10*3/uL (ref 140–400)
RBC: 4.82 MIL/uL (ref 4.20–5.80)
RDW: 15.2 % — ABNORMAL HIGH (ref 11.0–15.0)
WBC: 5.1 10*3/uL (ref 3.8–10.8)

## 2016-03-23 LAB — PHOSPHORUS: Phosphorus: 4.2 mg/dL (ref 2.5–4.5)

## 2016-03-23 LAB — CK: CK TOTAL: 155 U/L (ref 7–232)

## 2016-03-23 LAB — LIPASE: LIPASE: 17 U/L (ref 7–60)

## 2016-03-23 LAB — HIV ANTIBODY (ROUTINE TESTING W REFLEX): HIV 1&2 Ab, 4th Generation: NONREACTIVE

## 2016-03-23 LAB — AMYLASE: AMYLASE: 33 U/L (ref 0–105)

## 2016-03-23 NOTE — Progress Notes (Signed)
Study: A Phase 2b/3 Double Blind Safety and Efficacy Study of Injectable Cabotegravir compared to Daily Oral Tenofovir Disoproxil Fumarate/Emtricitabine (TDF/FTC), For Pre-Exposure Prophylaxis in HIV-Uninfected Cisgender Men and Transgender Women who have sex with Men.  Medication: Investigational Injectable Cabotegravir/placebo compared to Truvada/placebo. Duration: Around 4 years.  Bryan Fox is here for week 25. Hemorrhoid he thought he had was actually a rectal tear. MD had him stop all OTC and prescription medications used to treat his symptoms. He started using Vaseline and has resolved the tear. He has not missed any doses of oral study product. He returned #48 pills. Blood was drawn and HIV rapid was confirmed non-reactive. Injection given in right gluteal muscle with no problems. He received $50 gift card and condoms for this visit. Next appointment scheduled for 3/26 @ 10.

## 2016-03-30 ENCOUNTER — Encounter (INDEPENDENT_AMBULATORY_CARE_PROVIDER_SITE_OTHER): Payer: Self-pay | Admitting: *Deleted

## 2016-03-30 VITALS — BP 134/91 | HR 86 | Temp 97.8°F | Wt 155.5 lb

## 2016-03-30 DIAGNOSIS — Z006 Encounter for examination for normal comparison and control in clinical research program: Secondary | ICD-10-CM

## 2016-03-30 LAB — CBC WITH DIFFERENTIAL/PLATELET
BASOS PCT: 1 %
Basophils Absolute: 51 cells/uL (ref 0–200)
EOS ABS: 102 {cells}/uL (ref 15–500)
Eosinophils Relative: 2 %
HCT: 44.6 % (ref 38.5–50.0)
Hemoglobin: 14.7 g/dL (ref 13.2–17.1)
LYMPHS PCT: 47 %
Lymphs Abs: 2397 cells/uL (ref 850–3900)
MCH: 30.1 pg (ref 27.0–33.0)
MCHC: 33 g/dL (ref 32.0–36.0)
MCV: 91.4 fL (ref 80.0–100.0)
MONOS PCT: 9 %
MPV: 9.5 fL (ref 7.5–12.5)
Monocytes Absolute: 459 cells/uL (ref 200–950)
Neutro Abs: 2091 cells/uL (ref 1500–7800)
Neutrophils Relative %: 41 %
PLATELETS: 233 10*3/uL (ref 140–400)
RBC: 4.88 MIL/uL (ref 4.20–5.80)
RDW: 15.2 % — AB (ref 11.0–15.0)
WBC: 5.1 10*3/uL (ref 3.8–10.8)

## 2016-03-30 LAB — COMPREHENSIVE METABOLIC PANEL
ALK PHOS: 21 U/L — AB (ref 40–115)
ALT: 20 U/L (ref 9–46)
AST: 18 U/L (ref 10–40)
Albumin: 4.6 g/dL (ref 3.6–5.1)
BUN: 12 mg/dL (ref 7–25)
CALCIUM: 9.8 mg/dL (ref 8.6–10.3)
CO2: 26 mmol/L (ref 20–31)
Chloride: 104 mmol/L (ref 98–110)
Creat: 0.65 mg/dL (ref 0.60–1.35)
GLUCOSE: 93 mg/dL (ref 65–99)
POTASSIUM: 4.3 mmol/L (ref 3.5–5.3)
Sodium: 138 mmol/L (ref 135–146)
Total Bilirubin: 0.4 mg/dL (ref 0.2–1.2)
Total Protein: 7.2 g/dL (ref 6.1–8.1)

## 2016-03-30 LAB — LIPASE: Lipase: 15 U/L (ref 7–60)

## 2016-03-30 LAB — AMYLASE: AMYLASE: 32 U/L (ref 0–105)

## 2016-03-30 LAB — HIV ANTIBODY (ROUTINE TESTING W REFLEX): HIV 1&2 Ab, 4th Generation: NONREACTIVE

## 2016-03-30 LAB — PHOSPHORUS: PHOSPHORUS: 3.8 mg/dL (ref 2.5–4.5)

## 2016-03-30 LAB — CK: CK TOTAL: 106 U/L (ref 7–232)

## 2016-03-30 NOTE — Progress Notes (Signed)
Study: A Phase 2b/3 Double Blind Safety and Efficacy Study of Injectable Cabotegravir compared to Daily Oral Tenofovir Disoproxil Fumarate/Emtricitabine (TDF/FTC), For Pre-Exposure Prophylaxis in HIV-Uninfected Cisgender Men and Transgender Women who have sex with Men.  Medication: Investigational Injectable Cabotegravir/placebo compared to Truvada/placebo. Duration: Around 4 years.  Bryan Fox is here for week 27. He denies any new problems. Blood drawn and HIV rapid confirmed non-reactive. He denied any injection site reaction and has not worked out in past 4 days. Next appointment scheduled for 5/14 @ 10.

## 2016-05-18 ENCOUNTER — Encounter (INDEPENDENT_AMBULATORY_CARE_PROVIDER_SITE_OTHER): Payer: Self-pay | Admitting: *Deleted

## 2016-05-18 VITALS — BP 120/82 | HR 89 | Temp 98.3°F | Wt 151.5 lb

## 2016-05-18 DIAGNOSIS — Z006 Encounter for examination for normal comparison and control in clinical research program: Secondary | ICD-10-CM

## 2016-05-18 LAB — COMPREHENSIVE METABOLIC PANEL
ALK PHOS: 20 U/L — AB (ref 40–115)
ALT: 23 U/L (ref 9–46)
AST: 25 U/L (ref 10–40)
Albumin: 4.8 g/dL (ref 3.6–5.1)
BILIRUBIN TOTAL: 0.3 mg/dL (ref 0.2–1.2)
BUN: 16 mg/dL (ref 7–25)
CALCIUM: 9.9 mg/dL (ref 8.6–10.3)
CO2: 24 mmol/L (ref 20–31)
Chloride: 101 mmol/L (ref 98–110)
Creat: 0.87 mg/dL (ref 0.60–1.35)
GLUCOSE: 100 mg/dL — AB (ref 65–99)
POTASSIUM: 4.3 mmol/L (ref 3.5–5.3)
Sodium: 138 mmol/L (ref 135–146)
TOTAL PROTEIN: 7.4 g/dL (ref 6.1–8.1)

## 2016-05-18 LAB — CBC WITH DIFFERENTIAL/PLATELET
Basophils Absolute: 0 cells/uL (ref 0–200)
Basophils Relative: 0 %
EOS PCT: 2 %
Eosinophils Absolute: 114 cells/uL (ref 15–500)
HCT: 46.4 % (ref 38.5–50.0)
HEMOGLOBIN: 15.2 g/dL (ref 13.2–17.1)
LYMPHS ABS: 2850 {cells}/uL (ref 850–3900)
Lymphocytes Relative: 50 %
MCH: 30 pg (ref 27.0–33.0)
MCHC: 32.8 g/dL (ref 32.0–36.0)
MCV: 91.7 fL (ref 80.0–100.0)
MPV: 9.8 fL (ref 7.5–12.5)
Monocytes Absolute: 513 cells/uL (ref 200–950)
Monocytes Relative: 9 %
NEUTROS ABS: 2223 {cells}/uL (ref 1500–7800)
NEUTROS PCT: 39 %
Platelets: 240 10*3/uL (ref 140–400)
RBC: 5.06 MIL/uL (ref 4.20–5.80)
RDW: 15 % (ref 11.0–15.0)
WBC: 5.7 10*3/uL (ref 3.8–10.8)

## 2016-05-18 LAB — PHOSPHORUS: PHOSPHORUS: 3.5 mg/dL (ref 2.5–4.5)

## 2016-05-18 LAB — CK: Total CK: 114 U/L (ref 44–196)

## 2016-05-18 LAB — LIPASE: LIPASE: 13 U/L (ref 7–60)

## 2016-05-18 LAB — AMYLASE: Amylase: 31 U/L (ref 21–101)

## 2016-05-18 NOTE — Progress Notes (Signed)
Bryan Fox is here for his week 33 visit for Study: A Phase 2b/3 Double Blind Safety and Efficacy Study of Injectable Cabotegravir compared to Daily Oral Tenofovir Disoproxil Fumarate/Emtricitabine (TDF/FTC), For Pre-Exposure Prophylaxis in HIV-Uninfected Cisgender Men and Transgender Women who have sex with Men.  Medication: Investigational Injectable Cabotegravir/placebo compared to Truvada/placebo. Duration: Around 4 years.  He denies any new problems or concerns about the study or meds. He says he has a little sore inside his left nares, possibly from allergies. He is not taking any other meds besides the study meds and MVIs. His adherence has been 100%  And he is very diligent about not missing any doses. He had the injection in his lt buttock without any problem. He will return in 2 weeks for the followup visit.

## 2016-05-19 LAB — GC/CHLAMYDIA PROBE AMP
CT PROBE, AMP APTIMA: NOT DETECTED
GC Probe RNA: NOT DETECTED

## 2016-05-19 LAB — RPR

## 2016-05-19 LAB — HIV ANTIBODY (ROUTINE TESTING W REFLEX): HIV: NONREACTIVE

## 2016-05-22 LAB — CT/NG RNA, TMA RECTAL

## 2016-05-29 ENCOUNTER — Encounter (INDEPENDENT_AMBULATORY_CARE_PROVIDER_SITE_OTHER): Payer: Self-pay | Admitting: *Deleted

## 2016-05-29 VITALS — BP 131/84 | HR 70 | Temp 97.9°F | Wt 152.5 lb

## 2016-05-29 DIAGNOSIS — Z006 Encounter for examination for normal comparison and control in clinical research program: Secondary | ICD-10-CM

## 2016-05-29 LAB — CBC WITH DIFFERENTIAL/PLATELET
BASOS PCT: 1 %
Basophils Absolute: 56 cells/uL (ref 0–200)
Eosinophils Absolute: 112 cells/uL (ref 15–500)
Eosinophils Relative: 2 %
HCT: 47 % (ref 38.5–50.0)
HEMOGLOBIN: 15 g/dL (ref 13.2–17.1)
LYMPHS ABS: 3192 {cells}/uL (ref 850–3900)
Lymphocytes Relative: 57 %
MCH: 29.4 pg (ref 27.0–33.0)
MCHC: 31.9 g/dL — AB (ref 32.0–36.0)
MCV: 92.2 fL (ref 80.0–100.0)
MPV: 9.9 fL (ref 7.5–12.5)
Monocytes Absolute: 448 cells/uL (ref 200–950)
Monocytes Relative: 8 %
NEUTROS ABS: 1792 {cells}/uL (ref 1500–7800)
NEUTROS PCT: 32 %
Platelets: 253 10*3/uL (ref 140–400)
RBC: 5.1 MIL/uL (ref 4.20–5.80)
RDW: 15.1 % — ABNORMAL HIGH (ref 11.0–15.0)
WBC: 5.6 10*3/uL (ref 3.8–10.8)

## 2016-05-29 NOTE — Progress Notes (Signed)
Study: A Phase 2b/3 Double Blind Safety and Efficacy Study of Injectable Cabotegravir compared to Daily Oral Tenofovir Disoproxil Fumarate/Emtricitabine (TDF/FTC), For Pre-Exposure Prophylaxis in HIV-Uninfected Cisgender Men and Transgender Women who have sex with Men.  Medication: Investigational Injectable Cabotegravir/placebo compared to Truvada/placebo. Duration: Around 4 years.  Bryan Fox is here for week 35 visit. No new complaints or concerns verbalized. Denies any problems after last injection. No tenderness, bruising or swelling noted. Rapid HIV non-reactive. Obtained rectal GC/CT that was missed at week 33 visit. He will return in July for his next injection visit.

## 2016-05-30 LAB — COMPREHENSIVE METABOLIC PANEL
ALT: 18 U/L (ref 9–46)
AST: 18 U/L (ref 10–40)
Albumin: 4.6 g/dL (ref 3.6–5.1)
Alkaline Phosphatase: 20 U/L — ABNORMAL LOW (ref 40–115)
BILIRUBIN TOTAL: 0.4 mg/dL (ref 0.2–1.2)
BUN: 14 mg/dL (ref 7–25)
CO2: 24 mmol/L (ref 20–31)
CREATININE: 0.69 mg/dL (ref 0.60–1.35)
Calcium: 9.7 mg/dL (ref 8.6–10.3)
Chloride: 101 mmol/L (ref 98–110)
GLUCOSE: 92 mg/dL (ref 65–99)
Potassium: 4.7 mmol/L (ref 3.5–5.3)
SODIUM: 137 mmol/L (ref 135–146)
Total Protein: 7.2 g/dL (ref 6.1–8.1)

## 2016-05-30 LAB — HIV ANTIBODY (ROUTINE TESTING W REFLEX): HIV: NONREACTIVE

## 2016-05-30 LAB — LIPASE: LIPASE: 29 U/L (ref 7–60)

## 2016-05-30 LAB — PHOSPHORUS: PHOSPHORUS: 4.2 mg/dL (ref 2.5–4.5)

## 2016-05-30 LAB — CK: CK TOTAL: 106 U/L (ref 44–196)

## 2016-05-30 LAB — AMYLASE: Amylase: 39 U/L (ref 21–101)

## 2016-06-03 LAB — CT/NG RNA, TMA RECTAL
Chlamydia Trachomatis RNA: NOT DETECTED
NEISSERIA GONORRHOEAE RNA: NOT DETECTED

## 2016-07-13 ENCOUNTER — Encounter (INDEPENDENT_AMBULATORY_CARE_PROVIDER_SITE_OTHER): Payer: Self-pay | Admitting: *Deleted

## 2016-07-13 VITALS — BP 128/82 | HR 100 | Temp 98.7°F | Wt 155.5 lb

## 2016-07-13 DIAGNOSIS — Z006 Encounter for examination for normal comparison and control in clinical research program: Secondary | ICD-10-CM

## 2016-07-13 LAB — CBC WITH DIFFERENTIAL/PLATELET
Basophils Absolute: 50 cells/uL (ref 0–200)
Basophils Relative: 1 %
EOS ABS: 150 {cells}/uL (ref 15–500)
Eosinophils Relative: 3 %
HEMATOCRIT: 45.5 % (ref 38.5–50.0)
HEMOGLOBIN: 14.8 g/dL (ref 13.2–17.1)
LYMPHS ABS: 1750 {cells}/uL (ref 850–3900)
LYMPHS PCT: 35 %
MCH: 30.3 pg (ref 27.0–33.0)
MCHC: 32.5 g/dL (ref 32.0–36.0)
MCV: 93 fL (ref 80.0–100.0)
MONO ABS: 700 {cells}/uL (ref 200–950)
MPV: 9.8 fL (ref 7.5–12.5)
Monocytes Relative: 14 %
NEUTROS PCT: 47 %
Neutro Abs: 2350 cells/uL (ref 1500–7800)
Platelets: 211 10*3/uL (ref 140–400)
RBC: 4.89 MIL/uL (ref 4.20–5.80)
RDW: 15 % (ref 11.0–15.0)
WBC: 5 10*3/uL (ref 3.8–10.8)

## 2016-07-13 LAB — COMPREHENSIVE METABOLIC PANEL
ALBUMIN: 4.5 g/dL (ref 3.6–5.1)
ALT: 19 U/L (ref 9–46)
AST: 17 U/L (ref 10–40)
Alkaline Phosphatase: 23 U/L — ABNORMAL LOW (ref 40–115)
BILIRUBIN TOTAL: 0.3 mg/dL (ref 0.2–1.2)
BUN: 18 mg/dL (ref 7–25)
CALCIUM: 9.7 mg/dL (ref 8.6–10.3)
CHLORIDE: 101 mmol/L (ref 98–110)
CO2: 23 mmol/L (ref 20–31)
Creat: 0.72 mg/dL (ref 0.60–1.35)
Glucose, Bld: 85 mg/dL (ref 65–99)
Potassium: 4.2 mmol/L (ref 3.5–5.3)
SODIUM: 136 mmol/L (ref 135–146)
TOTAL PROTEIN: 7.2 g/dL (ref 6.1–8.1)

## 2016-07-13 LAB — PHOSPHORUS: PHOSPHORUS: 4 mg/dL (ref 2.5–4.5)

## 2016-07-13 NOTE — Progress Notes (Signed)
Study: A Phase 2b/3 Double Blind Safety and Efficacy Study of Injectable Cabotegravir compared to Daily Oral Tenofovir Disoproxil Fumarate/Emtricitabine (TDF/FTC), For Pre-Exposure Prophylaxis in HIV-Uninfected Cisgender Men and Transgender Women who have sex with Men.  Medication: Investigational Injectable Cabotegravir/placebo compared to Truvada/placebo. Duration: Around 4 years.  Bryan Fox is here for week 41. He denies any new changes. #34 pills returned of oral study products. HIV rapid confirmed non-reactive. Questionnaires completed. Injection given in right gluteal muscle with no problems. Oral study product dispensed. He received $50 gift card for visit. Next appointment scheduled for 7/23.

## 2016-07-13 NOTE — Progress Notes (Signed)
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 Gave ppt a business card. Explained mychart to ppt and showed them the [employer]. Ppt would like a text reminder for appts (confirmed phone number). Ppt gave permission to mail (confirmed mailing address). Sent the mychart request to ppt via text in [place].

## 2016-07-14 LAB — LIPASE: LIPASE: 17 U/L (ref 7–60)

## 2016-07-14 LAB — AMYLASE: Amylase: 30 U/L (ref 21–101)

## 2016-07-14 LAB — HIV ANTIBODY (ROUTINE TESTING W REFLEX): HIV: NONREACTIVE

## 2016-07-14 LAB — CK: Total CK: 134 U/L (ref 44–196)

## 2016-07-27 ENCOUNTER — Encounter (INDEPENDENT_AMBULATORY_CARE_PROVIDER_SITE_OTHER): Payer: Self-pay | Admitting: *Deleted

## 2016-07-27 VITALS — BP 122/78 | HR 72 | Temp 97.6°F | Wt 154.2 lb

## 2016-07-27 DIAGNOSIS — Z006 Encounter for examination for normal comparison and control in clinical research program: Secondary | ICD-10-CM

## 2016-07-27 LAB — LIPASE: Lipase: 12 U/L (ref 7–60)

## 2016-07-27 LAB — CBC WITH DIFFERENTIAL/PLATELET
BASOS ABS: 51 {cells}/uL (ref 0–200)
Basophils Relative: 1 %
EOS PCT: 3 %
Eosinophils Absolute: 153 cells/uL (ref 15–500)
HCT: 45.1 % (ref 38.5–50.0)
Hemoglobin: 14.6 g/dL (ref 13.2–17.1)
Lymphocytes Relative: 50 %
Lymphs Abs: 2550 cells/uL (ref 850–3900)
MCH: 30 pg (ref 27.0–33.0)
MCHC: 32.4 g/dL (ref 32.0–36.0)
MCV: 92.6 fL (ref 80.0–100.0)
MONOS PCT: 8 %
MPV: 10.4 fL (ref 7.5–12.5)
Monocytes Absolute: 408 cells/uL (ref 200–950)
NEUTROS ABS: 1938 {cells}/uL (ref 1500–7800)
Neutrophils Relative %: 38 %
PLATELETS: 120 10*3/uL — AB (ref 140–400)
RBC: 4.87 MIL/uL (ref 4.20–5.80)
RDW: 14.7 % (ref 11.0–15.0)
WBC: 5.1 10*3/uL (ref 3.8–10.8)

## 2016-07-27 LAB — AMYLASE: AMYLASE: 31 U/L (ref 21–101)

## 2016-07-27 LAB — CK: CK TOTAL: 99 U/L (ref 44–196)

## 2016-07-27 LAB — COMPREHENSIVE METABOLIC PANEL
ALK PHOS: 22 U/L — AB (ref 40–115)
ALT: 16 U/L (ref 9–46)
AST: 16 U/L (ref 10–40)
Albumin: 4.1 g/dL (ref 3.6–5.1)
BUN: 17 mg/dL (ref 7–25)
CHLORIDE: 106 mmol/L (ref 98–110)
CO2: 28 mmol/L (ref 20–31)
Calcium: 9.2 mg/dL (ref 8.6–10.3)
Creat: 0.89 mg/dL (ref 0.60–1.35)
GLUCOSE: 92 mg/dL (ref 65–99)
POTASSIUM: 4.5 mmol/L (ref 3.5–5.3)
Sodium: 139 mmol/L (ref 135–146)
Total Bilirubin: 0.3 mg/dL (ref 0.2–1.2)
Total Protein: 6.5 g/dL (ref 6.1–8.1)

## 2016-07-27 LAB — HIV ANTIBODY (ROUTINE TESTING W REFLEX): HIV 1&2 Ab, 4th Generation: NONREACTIVE

## 2016-07-27 LAB — PHOSPHORUS: Phosphorus: 4 mg/dL (ref 2.5–4.5)

## 2016-07-27 NOTE — Progress Notes (Signed)
Study: A Phase 2b/3 Double Blind Safety and Efficacy Study of Injectable Cabotegravir compared to Daily Oral Tenofovir Disoproxil Fumarate/Emtricitabine (TDF/FTC), For Pre-Exposure Prophylaxis in HIV-Uninfected Cisgender Men and Transgender Women who have sex with Men.  Medication: Investigational Injectable Cabotegravir/placebo compared to Truvada/placebo. Duration: Around 4 years.  Bryan Fox is here for week 43. Congestion in ears possibly from construction in the area. Reports no injection site reaction. Blood drawn and HIV rapid test was confirmed non-reactive. Questionnaire completed. He received $50 gift card for visit. Will see him next week.

## 2016-08-31 ENCOUNTER — Encounter (INDEPENDENT_AMBULATORY_CARE_PROVIDER_SITE_OTHER): Payer: Self-pay | Admitting: *Deleted

## 2016-08-31 VITALS — BP 131/86 | HR 72 | Temp 98.0°F | Wt 155.5 lb

## 2016-08-31 DIAGNOSIS — Z006 Encounter for examination for normal comparison and control in clinical research program: Secondary | ICD-10-CM

## 2016-08-31 LAB — COMPREHENSIVE METABOLIC PANEL
ALBUMIN: 4.7 g/dL (ref 3.6–5.1)
ALT: 26 U/L (ref 9–46)
AST: 19 U/L (ref 10–40)
Alkaline Phosphatase: 19 U/L — ABNORMAL LOW (ref 40–115)
BILIRUBIN TOTAL: 0.6 mg/dL (ref 0.2–1.2)
BUN: 24 mg/dL (ref 7–25)
CHLORIDE: 103 mmol/L (ref 98–110)
CO2: 25 mmol/L (ref 20–32)
CREATININE: 0.8 mg/dL (ref 0.60–1.35)
Calcium: 9.6 mg/dL (ref 8.6–10.3)
Glucose, Bld: 90 mg/dL (ref 65–99)
Potassium: 4.1 mmol/L (ref 3.5–5.3)
SODIUM: 137 mmol/L (ref 135–146)
TOTAL PROTEIN: 7.2 g/dL (ref 6.1–8.1)

## 2016-08-31 LAB — PHOSPHORUS: Phosphorus: 3.6 mg/dL (ref 2.5–4.5)

## 2016-08-31 LAB — CK: CK TOTAL: 130 U/L (ref 44–196)

## 2016-08-31 LAB — AMYLASE: AMYLASE: 28 U/L (ref 21–101)

## 2016-08-31 LAB — CBC WITH DIFFERENTIAL/PLATELET
BASOS ABS: 0 {cells}/uL (ref 0–200)
Basophils Relative: 0 %
EOS PCT: 2 %
Eosinophils Absolute: 130 cells/uL (ref 15–500)
HEMATOCRIT: 47 % (ref 38.5–50.0)
HEMOGLOBIN: 15.4 g/dL (ref 13.2–17.1)
LYMPHS ABS: 2730 {cells}/uL (ref 850–3900)
Lymphocytes Relative: 42 %
MCH: 30.3 pg (ref 27.0–33.0)
MCHC: 32.8 g/dL (ref 32.0–36.0)
MCV: 92.5 fL (ref 80.0–100.0)
MPV: 9.7 fL (ref 7.5–12.5)
Monocytes Absolute: 455 cells/uL (ref 200–950)
Monocytes Relative: 7 %
NEUTROS PCT: 49 %
Neutro Abs: 3185 cells/uL (ref 1500–7800)
Platelets: 239 10*3/uL (ref 140–400)
RBC: 5.08 MIL/uL (ref 4.20–5.80)
RDW: 14.5 % (ref 11.0–15.0)
WBC: 6.5 10*3/uL (ref 3.8–10.8)

## 2016-08-31 LAB — LIPASE: Lipase: 10 U/L (ref 7–60)

## 2016-08-31 NOTE — Progress Notes (Signed)
Study: A Phase 2b/3 Double Blind Safety and Efficacy Study of Injectable Cabotegravir compared to Daily Oral Tenofovir Disoproxil Fumarate/Emtricitabine (TDF/FTC), For Pre-Exposure Prophylaxis in HIV-Uninfected Cisgender Men and Transgender Women who have sex with Men.  Medication: Investigational Injectable Cabotegravir/placebo compared to Truvada/placebo. Duration: Around 4 years.  Bryan Fox is here for week 49. He denies any changes since last study visit. He is currently in an 8 week relationship and is excited about how it is going. He returned #41 pills of oral study product. Blood drawn and HIV confirmed non-reactive. Questionnaires completed. Injection given in left gluteal muscle with no problem. Oral study product dispensed. He received $50 gift card. Will see him 9/17 for f/u.

## 2016-09-01 LAB — HIV ANTIBODY (ROUTINE TESTING W REFLEX): HIV 1&2 Ab, 4th Generation: NONREACTIVE

## 2016-09-21 ENCOUNTER — Encounter: Payer: Self-pay | Admitting: *Deleted

## 2016-09-21 ENCOUNTER — Encounter (INDEPENDENT_AMBULATORY_CARE_PROVIDER_SITE_OTHER): Payer: Self-pay | Admitting: *Deleted

## 2016-09-21 VITALS — BP 124/78 | HR 76 | Temp 97.7°F | Wt 161.5 lb

## 2016-09-21 DIAGNOSIS — Z006 Encounter for examination for normal comparison and control in clinical research program: Secondary | ICD-10-CM

## 2016-09-21 NOTE — Progress Notes (Signed)
Study: A Phase 2b/3 Double Blind Safety and Efficacy Study of Injectable Cabotegravir compared to Daily Oral Tenofovir Disoproxil Fumarate/Emtricitabine (TDF/FTC), For Pre-Exposure Prophylaxis in HIV-Uninfected Cisgender Men and Transgender Women who have sex with Men.  Medication: Investigational Injectable Cabotegravir/placebo compared to Truvada/placebo. Duration: Around 4 years.  Bryan Fox is here for week 51. After verifying the correct version I explained/reviewed the informed consent vs. 2.0 in the language that he understood. Risk, benefits, responsibilities, and other options were reviewed. I answered his questions. Comprehension was assessed. He was given adequate time to consider his options. He verbalized understanding and signed the consent witnessed by me. I then gave him a copy of the consent. He had one day of injection site tenderness. No other issues noted. Assessment unchanged since last study visit. HIV rapid confirmed non-reactive. He received $50 gift card for visit. Next appointment scheduled for 10/29.

## 2016-09-22 LAB — CBC WITH DIFFERENTIAL/PLATELET
BASOS ABS: 39 {cells}/uL (ref 0–200)
Basophils Relative: 0.7 %
EOS PCT: 2.2 %
Eosinophils Absolute: 121 cells/uL (ref 15–500)
HEMATOCRIT: 43 % (ref 38.5–50.0)
Hemoglobin: 14.2 g/dL (ref 13.2–17.1)
LYMPHS ABS: 2695 {cells}/uL (ref 850–3900)
MCH: 29.8 pg (ref 27.0–33.0)
MCHC: 33 g/dL (ref 32.0–36.0)
MCV: 90.3 fL (ref 80.0–100.0)
MONOS PCT: 7.6 %
MPV: 10.1 fL (ref 7.5–12.5)
NEUTROS PCT: 40.5 %
Neutro Abs: 2228 cells/uL (ref 1500–7800)
Platelets: 226 10*3/uL (ref 140–400)
RBC: 4.76 10*6/uL (ref 4.20–5.80)
RDW: 13.4 % (ref 11.0–15.0)
TOTAL LYMPHOCYTE: 49 %
WBC mixed population: 418 cells/uL (ref 200–950)
WBC: 5.5 10*3/uL (ref 3.8–10.8)

## 2016-09-22 LAB — LIPASE: LIPASE: 14 U/L (ref 7–60)

## 2016-09-22 LAB — CK: Total CK: 156 U/L (ref 44–196)

## 2016-09-22 LAB — PHOSPHORUS: Phosphorus: 4.1 mg/dL (ref 2.5–4.5)

## 2016-09-22 LAB — COMPREHENSIVE METABOLIC PANEL
AG RATIO: 1.9 (calc) (ref 1.0–2.5)
ALT: 22 U/L (ref 9–46)
AST: 19 U/L (ref 10–40)
Albumin: 4.5 g/dL (ref 3.6–5.1)
Alkaline phosphatase (APISO): 22 U/L — ABNORMAL LOW (ref 40–115)
BILIRUBIN TOTAL: 0.3 mg/dL (ref 0.2–1.2)
BUN: 20 mg/dL (ref 7–25)
CALCIUM: 10 mg/dL (ref 8.6–10.3)
CHLORIDE: 102 mmol/L (ref 98–110)
CO2: 25 mmol/L (ref 20–32)
Creat: 0.63 mg/dL (ref 0.60–1.35)
GLOBULIN: 2.4 g/dL (ref 1.9–3.7)
GLUCOSE: 101 mg/dL — AB (ref 65–99)
Potassium: 4.2 mmol/L (ref 3.5–5.3)
SODIUM: 137 mmol/L (ref 135–146)
Total Protein: 6.9 g/dL (ref 6.1–8.1)

## 2016-09-22 LAB — AMYLASE: Amylase: 28 U/L (ref 21–101)

## 2016-09-22 LAB — HIV ANTIBODY (ROUTINE TESTING W REFLEX): HIV 1&2 Ab, 4th Generation: NONREACTIVE

## 2016-11-02 ENCOUNTER — Encounter (INDEPENDENT_AMBULATORY_CARE_PROVIDER_SITE_OTHER): Payer: Self-pay | Admitting: *Deleted

## 2016-11-02 VITALS — BP 142/89 | HR 60 | Temp 97.3°F | Wt 164.5 lb

## 2016-11-02 DIAGNOSIS — Z006 Encounter for examination for normal comparison and control in clinical research program: Secondary | ICD-10-CM

## 2016-11-02 NOTE — Progress Notes (Signed)
Study: A Phase 2b/3 Double Blind Safety and Efficacy Study of Injectable Cabotegravir compared to Daily Oral Tenofovir Disoproxil Fumarate/Emtricitabine (TDF/FTC), For Pre-Exposure Prophylaxis in HIV-Uninfected Cisgender Men and Transgender Women who have sex with Men.  Medication: Investigational Injectable Cabotegravir/placebo compared to Truvada/placebo. Duration: Around 4 years.  Bryan Fox is here for week 57 visit. Developed a tooth abscess a couple a weeks ago which required a root canal. States that it's feel better only mild gum tenderness where he had some more dental work done this past weekend on the tooth. No other concerns/complaints. Rapid HIV non-reactive. Cabotegravir/placebo injection given (L) gluteal muscle. Site unremarkable. No problems with his oral study medication and states that he has been very adherent. Next study visit scheduled for 11/17/16 at 10:00am.

## 2016-11-03 LAB — C. TRACHOMATIS/N. GONORRHOEAE RNA
C. TRACHOMATIS RNA, TMA: NOT DETECTED
N. GONORRHOEAE RNA, TMA: NOT DETECTED

## 2016-11-03 LAB — COMPREHENSIVE METABOLIC PANEL
AG RATIO: 1.9 (calc) (ref 1.0–2.5)
ALKALINE PHOSPHATASE (APISO): 21 U/L — AB (ref 40–115)
ALT: 22 U/L (ref 9–46)
AST: 18 U/L (ref 10–40)
Albumin: 4.5 g/dL (ref 3.6–5.1)
BUN: 13 mg/dL (ref 7–25)
CHLORIDE: 103 mmol/L (ref 98–110)
CO2: 27 mmol/L (ref 20–32)
Calcium: 9.7 mg/dL (ref 8.6–10.3)
Creat: 0.69 mg/dL (ref 0.60–1.35)
GLOBULIN: 2.4 g/dL (ref 1.9–3.7)
GLUCOSE: 99 mg/dL (ref 65–99)
Potassium: 4.1 mmol/L (ref 3.5–5.3)
Sodium: 139 mmol/L (ref 135–146)
Total Bilirubin: 0.5 mg/dL (ref 0.2–1.2)
Total Protein: 6.9 g/dL (ref 6.1–8.1)

## 2016-11-03 LAB — CBC WITH DIFFERENTIAL/PLATELET
BASOS ABS: 42 {cells}/uL (ref 0–200)
BASOS PCT: 0.8 %
EOS ABS: 120 {cells}/uL (ref 15–500)
Eosinophils Relative: 2.3 %
HCT: 43.8 % (ref 38.5–50.0)
HEMOGLOBIN: 14.6 g/dL (ref 13.2–17.1)
Lymphs Abs: 2262 cells/uL (ref 850–3900)
MCH: 29.6 pg (ref 27.0–33.0)
MCHC: 33.3 g/dL (ref 32.0–36.0)
MCV: 88.7 fL (ref 80.0–100.0)
MONOS PCT: 8.2 %
MPV: 10.6 fL (ref 7.5–12.5)
Neutro Abs: 2350 cells/uL (ref 1500–7800)
Neutrophils Relative %: 45.2 %
PLATELETS: 216 10*3/uL (ref 140–400)
RBC: 4.94 10*6/uL (ref 4.20–5.80)
RDW: 13 % (ref 11.0–15.0)
TOTAL LYMPHOCYTE: 43.5 %
WBC: 5.2 10*3/uL (ref 3.8–10.8)
WBCMIX: 426 {cells}/uL (ref 200–950)

## 2016-11-03 LAB — LIPID PANEL
CHOL/HDL RATIO: 1.9 (calc) (ref <5.0)
Cholesterol: 199 mg/dL (ref <200)
HDL: 103 mg/dL (ref >40)
LDL Cholesterol (Calc): 70 mg/dL (calc)
NON-HDL CHOLESTEROL (CALC): 96 mg/dL (ref <130)
Triglycerides: 179 mg/dL — ABNORMAL HIGH (ref <150)

## 2016-11-03 LAB — URINALYSIS, ROUTINE W REFLEX MICROSCOPIC
BILIRUBIN URINE: NEGATIVE
GLUCOSE, UA: NEGATIVE
HGB URINE DIPSTICK: NEGATIVE
KETONES UR: NEGATIVE
Leukocytes, UA: NEGATIVE
NITRITE: NEGATIVE
PH: 5.5 (ref 5.0–8.0)
Protein, ur: NEGATIVE
SPECIFIC GRAVITY, URINE: 1.023 (ref 1.001–1.03)

## 2016-11-03 LAB — HEPATITIS C ANTIBODY
HEP C AB: NONREACTIVE
SIGNAL TO CUT-OFF: 0.01 (ref <1.00)

## 2016-11-03 LAB — CK: Total CK: 81 U/L (ref 44–196)

## 2016-11-03 LAB — AMYLASE: AMYLASE: 28 U/L (ref 21–101)

## 2016-11-03 LAB — HIV ANTIBODY (ROUTINE TESTING W REFLEX): HIV: NONREACTIVE

## 2016-11-03 LAB — LIPASE: LIPASE: 12 U/L (ref 7–60)

## 2016-11-03 LAB — RPR: RPR: NONREACTIVE

## 2016-11-03 LAB — PHOSPHORUS: PHOSPHORUS: 3.4 mg/dL (ref 2.5–4.5)

## 2016-11-07 LAB — CT/NG RNA, TMA RECTAL
CHLAMYDIA TRACHOMATIS RNA: NOT DETECTED
Neisseria Gonorrhoeae RNA: NOT DETECTED

## 2016-11-17 ENCOUNTER — Encounter (INDEPENDENT_AMBULATORY_CARE_PROVIDER_SITE_OTHER): Payer: Self-pay | Admitting: *Deleted

## 2016-11-17 VITALS — BP 134/86 | HR 72 | Temp 98.0°F | Wt 158.5 lb

## 2016-11-17 DIAGNOSIS — Z006 Encounter for examination for normal comparison and control in clinical research program: Secondary | ICD-10-CM

## 2016-11-17 NOTE — Progress Notes (Signed)
Study: A Phase 2b/3 Double Blind Safety and Efficacy Study of Injectable Cabotegravir compared to Daily Oral Tenofovir Disoproxil Fumarate/Emtricitabine (TDF/FTC), For Pre-Exposure Prophylaxis in HIV-Uninfected Cisgender Men and Transgender Women who have sex with Men.  Medication: Investigational Injectable Cabotegravir/placebo compared to Truvada/placebo. Duration: Around 4 years.  [name] is here for week 59. He had a tooth abscess that was treated with Clindamycin. He then had GI upset but this has resolved. He denies any injection site reaction. HIV rapid confirmed non-reactive. He received $50 gift card for visit. Next appointment scheduled for 12/18.

## 2016-11-18 LAB — COMPREHENSIVE METABOLIC PANEL
AG RATIO: 1.9 (calc) (ref 1.0–2.5)
ALBUMIN MSPROF: 4.6 g/dL (ref 3.6–5.1)
ALT: 26 U/L (ref 9–46)
AST: 23 U/L (ref 10–40)
Alkaline phosphatase (APISO): 22 U/L — ABNORMAL LOW (ref 40–115)
BUN: 15 mg/dL (ref 7–25)
CHLORIDE: 105 mmol/L (ref 98–110)
CO2: 24 mmol/L (ref 20–32)
CREATININE: 0.6 mg/dL (ref 0.60–1.35)
Calcium: 9.2 mg/dL (ref 8.6–10.3)
GLOBULIN: 2.4 g/dL (ref 1.9–3.7)
GLUCOSE: 82 mg/dL (ref 65–99)
POTASSIUM: 4.2 mmol/L (ref 3.5–5.3)
SODIUM: 137 mmol/L (ref 135–146)
Total Bilirubin: 0.3 mg/dL (ref 0.2–1.2)
Total Protein: 7 g/dL (ref 6.1–8.1)

## 2016-11-18 LAB — CBC WITH DIFFERENTIAL/PLATELET
BASOS ABS: 41 {cells}/uL (ref 0–200)
BASOS PCT: 0.9 %
EOS ABS: 99 {cells}/uL (ref 15–500)
EOS PCT: 2.2 %
HEMATOCRIT: 41.4 % (ref 38.5–50.0)
HEMOGLOBIN: 14 g/dL (ref 13.2–17.1)
LYMPHS ABS: 2057 {cells}/uL (ref 850–3900)
MCH: 29.9 pg (ref 27.0–33.0)
MCHC: 33.8 g/dL (ref 32.0–36.0)
MCV: 88.5 fL (ref 80.0–100.0)
MPV: 9.9 fL (ref 7.5–12.5)
Monocytes Relative: 9.4 %
NEUTROS ABS: 1881 {cells}/uL (ref 1500–7800)
Neutrophils Relative %: 41.8 %
Platelets: 238 10*3/uL (ref 140–400)
RBC: 4.68 10*6/uL (ref 4.20–5.80)
RDW: 13 % (ref 11.0–15.0)
Total Lymphocyte: 45.7 %
WBC mixed population: 423 cells/uL (ref 200–950)
WBC: 4.5 10*3/uL (ref 3.8–10.8)

## 2016-11-18 LAB — HIV ANTIBODY (ROUTINE TESTING W REFLEX): HIV 1&2 Ab, 4th Generation: NONREACTIVE

## 2016-11-18 LAB — LIPASE: Lipase: 11 U/L (ref 7–60)

## 2016-11-18 LAB — CK: Total CK: 121 U/L (ref 44–196)

## 2016-11-18 LAB — PHOSPHORUS: PHOSPHORUS: 3.3 mg/dL (ref 2.5–4.5)

## 2016-11-18 LAB — AMYLASE: AMYLASE: 31 U/L (ref 21–101)

## 2016-12-22 ENCOUNTER — Encounter: Payer: Self-pay | Admitting: *Deleted

## 2016-12-22 VITALS — BP 133/89 | HR 77 | Temp 98.1°F | Wt 158.0 lb

## 2016-12-22 DIAGNOSIS — Z006 Encounter for examination for normal comparison and control in clinical research program: Secondary | ICD-10-CM

## 2016-12-22 DIAGNOSIS — R197 Diarrhea, unspecified: Secondary | ICD-10-CM

## 2016-12-22 NOTE — Progress Notes (Signed)
Study: A Phase 2b/3 Double Blind Safety and Efficacy Study of Injectable Cabotegravir compared to Daily Oral Tenofovir Disoproxil Fumarate/Emtricitabine (TDF/FTC), For Pre-Exposure Prophylaxis in HIV-Uninfected Cisgender Men and Transgender Women who have sex with Men.  Medication: Investigational Injectable Cabotegravir/placebo compared to Truvada/placebo. Duration: Around 4 years.  Bryan Fox is here for week 65. He has had intermittent loose/watery stools since the beginning of study. He has tried several things including changing his diet, anti-diarrheals; vanc was prescribed as he was thought to have c-diff but this was negative. Diarrhea did improve while he was taking vanc but worse when stopped. He has been vague about his symptoms but is now at a point where he does not want to tolerate this anymore. Spoke with Dr. Daiva EvesVan Dam about symptoms and ordered GI pathogen panel. If these results are negative then he will need to see GI for a work up. Dr. Daiva EvesVan Dam felt it was unlikely that it is due to study product but could not rule out since these loose stools have been occurring since study drug was on board. This being said participant wants to continue with study and study products. Questionnaires completed. HIV rapid confirmed non-reactive. He returned #39 oral study products. Injection given in right gluteal muscle. Oral study product dispensed. Will see him in January for followup

## 2016-12-23 ENCOUNTER — Other Ambulatory Visit: Payer: Self-pay | Admitting: Family Medicine

## 2016-12-23 DIAGNOSIS — R109 Unspecified abdominal pain: Secondary | ICD-10-CM

## 2016-12-23 LAB — CBC WITH DIFFERENTIAL/PLATELET
BASOS PCT: 0.7 %
Basophils Absolute: 39 cells/uL (ref 0–200)
EOS PCT: 2.1 %
Eosinophils Absolute: 118 cells/uL (ref 15–500)
HEMATOCRIT: 43.3 % (ref 38.5–50.0)
HEMOGLOBIN: 14.1 g/dL (ref 13.2–17.1)
LYMPHS ABS: 2313 {cells}/uL (ref 850–3900)
MCH: 29.3 pg (ref 27.0–33.0)
MCHC: 32.6 g/dL (ref 32.0–36.0)
MCV: 90 fL (ref 80.0–100.0)
MPV: 10.7 fL (ref 7.5–12.5)
Monocytes Relative: 9.1 %
NEUTROS ABS: 2621 {cells}/uL (ref 1500–7800)
NEUTROS PCT: 46.8 %
Platelets: 234 10*3/uL (ref 140–400)
RBC: 4.81 10*6/uL (ref 4.20–5.80)
RDW: 13.1 % (ref 11.0–15.0)
Total Lymphocyte: 41.3 %
WBC: 5.6 10*3/uL (ref 3.8–10.8)
WBCMIX: 510 {cells}/uL (ref 200–950)

## 2016-12-23 LAB — AMYLASE: AMYLASE: 31 U/L (ref 21–101)

## 2016-12-23 LAB — COMPREHENSIVE METABOLIC PANEL
AG RATIO: 1.7 (calc) (ref 1.0–2.5)
ALT: 35 U/L (ref 9–46)
AST: 19 U/L (ref 10–40)
Albumin: 4.3 g/dL (ref 3.6–5.1)
Alkaline phosphatase (APISO): 21 U/L — ABNORMAL LOW (ref 40–115)
BUN: 14 mg/dL (ref 7–25)
CO2: 27 mmol/L (ref 20–32)
Calcium: 9.6 mg/dL (ref 8.6–10.3)
Chloride: 104 mmol/L (ref 98–110)
Creat: 0.63 mg/dL (ref 0.60–1.35)
GLUCOSE: 100 mg/dL — AB (ref 65–99)
Globulin: 2.6 g/dL (calc) (ref 1.9–3.7)
Potassium: 4.1 mmol/L (ref 3.5–5.3)
SODIUM: 138 mmol/L (ref 135–146)
TOTAL PROTEIN: 6.9 g/dL (ref 6.1–8.1)
Total Bilirubin: 0.5 mg/dL (ref 0.2–1.2)

## 2016-12-23 LAB — CK: Total CK: 103 U/L (ref 44–196)

## 2016-12-23 LAB — LIPASE: LIPASE: 7 U/L (ref 7–60)

## 2016-12-23 LAB — HIV ANTIBODY (ROUTINE TESTING W REFLEX): HIV: NONREACTIVE

## 2016-12-23 LAB — PHOSPHORUS: PHOSPHORUS: 3 mg/dL (ref 2.5–4.5)

## 2016-12-24 ENCOUNTER — Ambulatory Visit
Admission: RE | Admit: 2016-12-24 | Discharge: 2016-12-24 | Disposition: A | Discharge status: Home / Self Care | Payer: PRIVATE HEALTH INSURANCE | Source: Ambulatory Visit | Attending: Family Medicine | Admitting: Family Medicine

## 2016-12-24 DIAGNOSIS — R109 Unspecified abdominal pain: Secondary | ICD-10-CM

## 2016-12-24 LAB — GASTROINTESTINAL PATHOGEN PANEL PCR
C. DIFFICILE TOX A/B, PCR: NOT DETECTED
CAMPYLOBACTER, PCR: NOT DETECTED
CRYPTOSPORIDIUM, PCR: DETECTED — AB
E COLI 0157, PCR: NOT DETECTED
E coli (ETEC) LT/ST PCR: NOT DETECTED
E coli (STEC) stx1/stx2, PCR: NOT DETECTED
GIARDIA LAMBLIA, PCR: NOT DETECTED
Norovirus, PCR: NOT DETECTED
Rotavirus A, PCR: NOT DETECTED
SALMONELLA, PCR: DETECTED — AB
Shigella, PCR: NOT DETECTED

## 2016-12-24 LAB — TIQ-NTM

## 2016-12-25 ENCOUNTER — Other Ambulatory Visit: Payer: Self-pay | Admitting: *Deleted

## 2016-12-25 DIAGNOSIS — A029 Salmonella infection, unspecified: Secondary | ICD-10-CM | POA: Insufficient documentation

## 2016-12-25 DIAGNOSIS — Z2089 Contact with and (suspected) exposure to other communicable diseases: Secondary | ICD-10-CM | POA: Insufficient documentation

## 2016-12-25 MED ORDER — CIPROFLOXACIN HCL 500 MG PO TABS: 500.0000 mg | ORAL_TABLET | Freq: Two times a day (BID) | ORAL | 0 refills | Status: AC

## 2016-12-25 MED ORDER — NITAZOXANIDE 500 MG PO TABS: 500.0000 mg | ORAL_TABLET | Freq: Three times a day (TID) | ORAL | 0 refills | Status: AC

## 2017-01-11 ENCOUNTER — Encounter (INDEPENDENT_AMBULATORY_CARE_PROVIDER_SITE_OTHER): Payer: PRIVATE HEALTH INSURANCE | Admitting: *Deleted

## 2017-01-11 VITALS — BP 114/82 | HR 69 | Temp 97.8°F | Wt 158.8 lb

## 2017-01-11 DIAGNOSIS — Z006 Encounter for examination for normal comparison and control in clinical research program: Secondary | ICD-10-CM

## 2017-01-11 NOTE — Progress Notes (Signed)
Bryan Fox is here for his followup for [id number]. He denies any injection site reaction. He says his diarrhea cleared up 7 days after he finished the last antibiotics. He was seen at [facility] where they had treated him for C-dif and he was given vancomycin. He had been having some rt flank pain which they atributed to passing a kidney stone and he had a CT done, which showed another stone in his left kidney and a 4mm nodule in his R L lung, which he is concerned about since his father died of lung cancer. He will be returning here on Feb. 19th for the next injection visit.

## 2017-01-12 LAB — COMPREHENSIVE METABOLIC PANEL
AG RATIO: 1.8 (calc) (ref 1.0–2.5)
ALT: 18 U/L (ref 9–46)
AST: 13 U/L (ref 10–40)
Albumin: 4.2 g/dL (ref 3.6–5.1)
Alkaline phosphatase (APISO): 21 U/L — ABNORMAL LOW (ref 40–115)
BILIRUBIN TOTAL: 0.6 mg/dL (ref 0.2–1.2)
BUN: 16 mg/dL (ref 7–25)
CALCIUM: 9.4 mg/dL (ref 8.6–10.3)
CHLORIDE: 104 mmol/L (ref 98–110)
CO2: 25 mmol/L (ref 20–32)
Creat: 0.61 mg/dL (ref 0.60–1.35)
GLOBULIN: 2.4 g/dL (ref 1.9–3.7)
Glucose, Bld: 101 mg/dL — ABNORMAL HIGH (ref 65–99)
Potassium: 3.9 mmol/L (ref 3.5–5.3)
Sodium: 137 mmol/L (ref 135–146)
Total Protein: 6.6 g/dL (ref 6.1–8.1)

## 2017-01-12 LAB — CBC WITH DIFFERENTIAL/PLATELET
BASOS ABS: 29 {cells}/uL (ref 0–200)
BASOS PCT: 0.6 %
EOS ABS: 98 {cells}/uL (ref 15–500)
Eosinophils Relative: 2 %
HEMATOCRIT: 43 % (ref 38.5–50.0)
HEMOGLOBIN: 14.4 g/dL (ref 13.2–17.1)
Lymphs Abs: 2573 cells/uL (ref 850–3900)
MCH: 29.7 pg (ref 27.0–33.0)
MCHC: 33.5 g/dL (ref 32.0–36.0)
MCV: 88.7 fL (ref 80.0–100.0)
MONOS PCT: 8.1 %
MPV: 10.5 fL (ref 7.5–12.5)
NEUTROS ABS: 1803 {cells}/uL (ref 1500–7800)
Neutrophils Relative %: 36.8 %
Platelets: 229 10*3/uL (ref 140–400)
RBC: 4.85 10*6/uL (ref 4.20–5.80)
RDW: 13.2 % (ref 11.0–15.0)
Total Lymphocyte: 52.5 %
WBC: 4.9 10*3/uL (ref 3.8–10.8)
WBCMIX: 397 {cells}/uL (ref 200–950)

## 2017-01-12 LAB — AMYLASE: Amylase: 28 U/L (ref 21–101)

## 2017-01-12 LAB — LIPASE: LIPASE: 7 U/L (ref 7–60)

## 2017-01-12 LAB — HIV ANTIBODY (ROUTINE TESTING W REFLEX): HIV 1&2 Ab, 4th Generation: NONREACTIVE

## 2017-01-12 LAB — CK: Total CK: 100 U/L (ref 44–196)

## 2017-01-12 LAB — PHOSPHORUS: PHOSPHORUS: 3.5 mg/dL (ref 2.5–4.5)

## 2017-02-23 ENCOUNTER — Encounter (INDEPENDENT_AMBULATORY_CARE_PROVIDER_SITE_OTHER): Payer: Self-pay | Admitting: *Deleted

## 2017-02-23 VITALS — BP 117/79 | HR 74 | Temp 98.4°F | Wt 156.5 lb

## 2017-02-23 DIAGNOSIS — Z006 Encounter for examination for normal comparison and control in clinical research program: Secondary | ICD-10-CM

## 2017-02-23 NOTE — Progress Notes (Signed)
Study: A Phase 2b/3 Double Blind Safety and Efficacy Study of Injectable Cabotegravir compared to Daily Oral Tenofovir Disoproxil Fumarate/Emtricitabine (TDF/FTC), For Pre-Exposure Prophylaxis in HIV-Uninfected Cisgender Men and Transgender Women who have sex with Men.  Medication: Investigational Injectable Cabotegravir/placebo compared to Truvada/placebo. Duration: Around 4 years.  Bryan Fox is here for week 73 visit. No new complaint or concerns. States that he recently start taking CBD oil daily for work related anxiety. His adherence is 100% and he is very careful about not missing any doses. Rapid HIV non-reactive. Injection given (R) buttock without any problem. He is to return in 2 weeks for follow-up visit.

## 2017-02-24 LAB — CBC WITH DIFFERENTIAL/PLATELET
BASOS PCT: 1.1 %
Basophils Absolute: 50 cells/uL (ref 0–200)
Eosinophils Absolute: 90 cells/uL (ref 15–500)
Eosinophils Relative: 2 %
HCT: 42.3 % (ref 38.5–50.0)
HEMOGLOBIN: 14 g/dL (ref 13.2–17.1)
Lymphs Abs: 2853 cells/uL (ref 850–3900)
MCH: 29.1 pg (ref 27.0–33.0)
MCHC: 33.1 g/dL (ref 32.0–36.0)
MCV: 87.9 fL (ref 80.0–100.0)
MONOS PCT: 7.8 %
MPV: 9.7 fL (ref 7.5–12.5)
NEUTROS ABS: 1157 {cells}/uL — AB (ref 1500–7800)
Neutrophils Relative %: 25.7 %
PLATELETS: 230 10*3/uL (ref 140–400)
RBC: 4.81 10*6/uL (ref 4.20–5.80)
RDW: 13.9 % (ref 11.0–15.0)
TOTAL LYMPHOCYTE: 63.4 %
WBC mixed population: 351 cells/uL (ref 200–950)
WBC: 4.5 10*3/uL (ref 3.8–10.8)

## 2017-02-24 LAB — COMPREHENSIVE METABOLIC PANEL
AG RATIO: 2 (calc) (ref 1.0–2.5)
ALBUMIN MSPROF: 4.6 g/dL (ref 3.6–5.1)
ALT: 22 U/L (ref 9–46)
AST: 25 U/L (ref 10–40)
Alkaline phosphatase (APISO): 22 U/L — ABNORMAL LOW (ref 40–115)
BUN: 10 mg/dL (ref 7–25)
CHLORIDE: 108 mmol/L (ref 98–110)
CO2: 27 mmol/L (ref 20–32)
CREATININE: 0.62 mg/dL (ref 0.60–1.35)
Calcium: 9.4 mg/dL (ref 8.6–10.3)
GLOBULIN: 2.3 g/dL (ref 1.9–3.7)
GLUCOSE: 91 mg/dL (ref 65–99)
POTASSIUM: 4 mmol/L (ref 3.5–5.3)
SODIUM: 142 mmol/L (ref 135–146)
TOTAL PROTEIN: 6.9 g/dL (ref 6.1–8.1)
Total Bilirubin: 0.4 mg/dL (ref 0.2–1.2)

## 2017-02-24 LAB — AMYLASE: Amylase: 26 U/L (ref 21–101)

## 2017-02-24 LAB — PHOSPHORUS: Phosphorus: 3.1 mg/dL (ref 2.5–4.5)

## 2017-02-24 LAB — LIPASE: Lipase: 15 U/L (ref 7–60)

## 2017-02-24 LAB — HIV ANTIBODY (ROUTINE TESTING W REFLEX): HIV 1&2 Ab, 4th Generation: NONREACTIVE

## 2017-02-24 LAB — CK: Total CK: 235 U/L — ABNORMAL HIGH (ref 44–196)

## 2017-03-09 ENCOUNTER — Encounter (INDEPENDENT_AMBULATORY_CARE_PROVIDER_SITE_OTHER): Payer: Self-pay | Admitting: *Deleted

## 2017-03-09 VITALS — BP 132/93 | HR 77 | Temp 97.6°F | Wt 160.8 lb

## 2017-03-09 DIAGNOSIS — Z006 Encounter for examination for normal comparison and control in clinical research program: Secondary | ICD-10-CM

## 2017-03-09 NOTE — Progress Notes (Signed)
Study: A Phase 2b/3 Double Blind Safety and Efficacy Study of Injectable Cabotegravir compared to Daily Oral Tenofovir Disoproxil Fumarate/Emtricitabine (TDF/FTC), For Pre-Exposure Prophylaxis in HIV-Uninfected Cisgender Men and Transgender Women who have sex with Men.  Medication: Investigational Injectable Cabotegravir/placebo compared to Truvada/placebo. Duration: Around 4 years.  Bryan Fox is here for week 75 visit. Tested positive for flu last week. Given Tamiflu which he completed on Friday. Fractured 2 ribs on (R) side from coughing. Continues to have some mild discomfort related to rib fractures otherwise feeling much better. States that today will be his first day back to work. Denies any problems with his last injection. Rapid HIV non-reactive. He will return in April for his next injection visit.

## 2017-03-10 LAB — CBC WITH DIFFERENTIAL/PLATELET
BASOS PCT: 0.7 %
Basophils Absolute: 50 cells/uL (ref 0–200)
EOS PCT: 1.5 %
Eosinophils Absolute: 108 cells/uL (ref 15–500)
HEMATOCRIT: 43.8 % (ref 38.5–50.0)
HEMOGLOBIN: 14.7 g/dL (ref 13.2–17.1)
LYMPHS ABS: 3802 {cells}/uL (ref 850–3900)
MCH: 29.6 pg (ref 27.0–33.0)
MCHC: 33.6 g/dL (ref 32.0–36.0)
MCV: 88.3 fL (ref 80.0–100.0)
MPV: 10.1 fL (ref 7.5–12.5)
Monocytes Relative: 7.7 %
NEUTROS ABS: 2686 {cells}/uL (ref 1500–7800)
Neutrophils Relative %: 37.3 %
Platelets: 276 10*3/uL (ref 140–400)
RBC: 4.96 10*6/uL (ref 4.20–5.80)
RDW: 13.7 % (ref 11.0–15.0)
Total Lymphocyte: 52.8 %
WBC: 7.2 10*3/uL (ref 3.8–10.8)
WBCMIX: 554 {cells}/uL (ref 200–950)

## 2017-03-10 LAB — COMPREHENSIVE METABOLIC PANEL
AG RATIO: 1.8 (calc) (ref 1.0–2.5)
ALBUMIN MSPROF: 4.5 g/dL (ref 3.6–5.1)
ALKALINE PHOSPHATASE (APISO): 26 U/L — AB (ref 40–115)
ALT: 39 U/L (ref 9–46)
AST: 26 U/L (ref 10–40)
BILIRUBIN TOTAL: 0.2 mg/dL (ref 0.2–1.2)
BUN: 20 mg/dL (ref 7–25)
CALCIUM: 10 mg/dL (ref 8.6–10.3)
CHLORIDE: 102 mmol/L (ref 98–110)
CO2: 27 mmol/L (ref 20–32)
Creat: 0.65 mg/dL (ref 0.60–1.35)
Globulin: 2.5 g/dL (calc) (ref 1.9–3.7)
Glucose, Bld: 96 mg/dL (ref 65–99)
POTASSIUM: 4.2 mmol/L (ref 3.5–5.3)
Sodium: 138 mmol/L (ref 135–146)
Total Protein: 7 g/dL (ref 6.1–8.1)

## 2017-03-10 LAB — HIV ANTIBODY (ROUTINE TESTING W REFLEX): HIV 1&2 Ab, 4th Generation: NONREACTIVE

## 2017-03-10 LAB — LIPASE: Lipase: 13 U/L (ref 7–60)

## 2017-03-10 LAB — AMYLASE: AMYLASE: 35 U/L (ref 21–101)

## 2017-03-10 LAB — CK: Total CK: 59 U/L (ref 44–196)

## 2017-03-10 LAB — PHOSPHORUS: Phosphorus: 4.2 mg/dL (ref 2.5–4.5)

## 2017-04-19 ENCOUNTER — Encounter (INDEPENDENT_AMBULATORY_CARE_PROVIDER_SITE_OTHER): Payer: Self-pay | Admitting: *Deleted

## 2017-04-19 VITALS — BP 135/88 | HR 74 | Temp 98.1°F | Wt 165.8 lb

## 2017-04-19 DIAGNOSIS — Z006 Encounter for examination for normal comparison and control in clinical research program: Secondary | ICD-10-CM

## 2017-04-19 NOTE — Progress Notes (Signed)
Bryan Fox is here for his week 81 HPTN 083 visit. No new complaints or concerns. 100% adherence with oral study medication. Rapid HIV non-reactive. Cabotegravir/placebo injection given (R) buttock. Site unremarkable. He will return in 2 weeks for next study visit.

## 2017-04-20 LAB — COMPREHENSIVE METABOLIC PANEL
AG Ratio: 1.8 (calc) (ref 1.0–2.5)
ALBUMIN MSPROF: 4.6 g/dL (ref 3.6–5.1)
ALT: 23 U/L (ref 9–46)
AST: 21 U/L (ref 10–40)
Alkaline phosphatase (APISO): 24 U/L — ABNORMAL LOW (ref 40–115)
BUN: 15 mg/dL (ref 7–25)
CO2: 29 mmol/L (ref 20–32)
CREATININE: 0.71 mg/dL (ref 0.60–1.35)
Calcium: 9.7 mg/dL (ref 8.6–10.3)
Chloride: 105 mmol/L (ref 98–110)
GLUCOSE: 104 mg/dL — AB (ref 65–99)
Globulin: 2.5 g/dL (calc) (ref 1.9–3.7)
POTASSIUM: 4.7 mmol/L (ref 3.5–5.3)
Sodium: 137 mmol/L (ref 135–146)
Total Bilirubin: 0.4 mg/dL (ref 0.2–1.2)
Total Protein: 7.1 g/dL (ref 6.1–8.1)

## 2017-04-20 LAB — CBC WITH DIFFERENTIAL/PLATELET
BASOS PCT: 0.7 %
Basophils Absolute: 41 cells/uL (ref 0–200)
EOS PCT: 2.2 %
Eosinophils Absolute: 128 cells/uL (ref 15–500)
HCT: 42.7 % (ref 38.5–50.0)
Hemoglobin: 14.3 g/dL (ref 13.2–17.1)
Lymphs Abs: 3248 cells/uL (ref 850–3900)
MCH: 29.7 pg (ref 27.0–33.0)
MCHC: 33.5 g/dL (ref 32.0–36.0)
MCV: 88.6 fL (ref 80.0–100.0)
MONOS PCT: 8.6 %
MPV: 10.5 fL (ref 7.5–12.5)
NEUTROS PCT: 32.5 %
Neutro Abs: 1885 cells/uL (ref 1500–7800)
PLATELETS: 222 10*3/uL (ref 140–400)
RBC: 4.82 10*6/uL (ref 4.20–5.80)
RDW: 13.8 % (ref 11.0–15.0)
TOTAL LYMPHOCYTE: 56 %
WBC: 5.8 10*3/uL (ref 3.8–10.8)
WBCMIX: 499 {cells}/uL (ref 200–950)

## 2017-04-20 LAB — C. TRACHOMATIS/N. GONORRHOEAE RNA
C. TRACHOMATIS RNA, TMA: NOT DETECTED
N. GONORRHOEAE RNA, TMA: NOT DETECTED

## 2017-04-20 LAB — CK: CK TOTAL: 120 U/L (ref 44–196)

## 2017-04-20 LAB — LIPASE: LIPASE: 12 U/L (ref 7–60)

## 2017-04-20 LAB — AMYLASE: AMYLASE: 32 U/L (ref 21–101)

## 2017-04-20 LAB — HIV ANTIBODY (ROUTINE TESTING W REFLEX): HIV: NONREACTIVE

## 2017-04-20 LAB — RPR: RPR: NONREACTIVE

## 2017-04-20 LAB — PHOSPHORUS: Phosphorus: 4.2 mg/dL (ref 2.5–4.5)

## 2017-04-22 LAB — CT/NG RNA, TMA RECTAL
Chlamydia Trachomatis RNA: NOT DETECTED
Neisseria Gonorrhoeae RNA: NOT DETECTED

## 2017-05-04 ENCOUNTER — Encounter (INDEPENDENT_AMBULATORY_CARE_PROVIDER_SITE_OTHER): Payer: Self-pay | Admitting: *Deleted

## 2017-05-04 VITALS — BP 135/89 | HR 73 | Temp 98.0°F | Wt 159.5 lb

## 2017-05-04 DIAGNOSIS — Z006 Encounter for examination for normal comparison and control in clinical research program: Secondary | ICD-10-CM

## 2017-05-04 LAB — CBC WITH DIFFERENTIAL/PLATELET
BASOS PCT: 0.6 %
Basophils Absolute: 32 cells/uL (ref 0–200)
EOS ABS: 80 {cells}/uL (ref 15–500)
Eosinophils Relative: 1.5 %
HEMATOCRIT: 41.9 % (ref 38.5–50.0)
Hemoglobin: 13.9 g/dL (ref 13.2–17.1)
LYMPHS ABS: 2019 {cells}/uL (ref 850–3900)
MCH: 29.3 pg (ref 27.0–33.0)
MCHC: 33.2 g/dL (ref 32.0–36.0)
MCV: 88.2 fL (ref 80.0–100.0)
MPV: 10.1 fL (ref 7.5–12.5)
Monocytes Relative: 8.6 %
NEUTROS PCT: 51.2 %
Neutro Abs: 2714 cells/uL (ref 1500–7800)
PLATELETS: 237 10*3/uL (ref 140–400)
RBC: 4.75 10*6/uL (ref 4.20–5.80)
RDW: 13.6 % (ref 11.0–15.0)
TOTAL LYMPHOCYTE: 38.1 %
WBC: 5.3 10*3/uL (ref 3.8–10.8)
WBCMIX: 456 {cells}/uL (ref 200–950)

## 2017-05-04 LAB — COMPREHENSIVE METABOLIC PANEL
AG Ratio: 1.8 (calc) (ref 1.0–2.5)
ALBUMIN MSPROF: 4.6 g/dL (ref 3.6–5.1)
ALKALINE PHOSPHATASE (APISO): 22 U/L — AB (ref 40–115)
ALT: 21 U/L (ref 9–46)
AST: 22 U/L (ref 10–40)
BILIRUBIN TOTAL: 0.3 mg/dL (ref 0.2–1.2)
BUN: 16 mg/dL (ref 7–25)
CALCIUM: 9.6 mg/dL (ref 8.6–10.3)
CHLORIDE: 103 mmol/L (ref 98–110)
CO2: 29 mmol/L (ref 20–32)
CREATININE: 0.73 mg/dL (ref 0.60–1.35)
GLOBULIN: 2.5 g/dL (ref 1.9–3.7)
Glucose, Bld: 100 mg/dL — ABNORMAL HIGH (ref 65–99)
POTASSIUM: 4.8 mmol/L (ref 3.5–5.3)
Sodium: 139 mmol/L (ref 135–146)
Total Protein: 7.1 g/dL (ref 6.1–8.1)

## 2017-05-04 LAB — CK: Total CK: 90 U/L (ref 44–196)

## 2017-05-04 LAB — AMYLASE: AMYLASE: 27 U/L (ref 21–101)

## 2017-05-04 LAB — PHOSPHORUS: Phosphorus: 3.4 mg/dL (ref 2.5–4.5)

## 2017-05-04 LAB — LIPASE: LIPASE: 9 U/L (ref 7–60)

## 2017-05-04 NOTE — Progress Notes (Signed)
Study: A Phase 2b/3 Double Blind Safety and Efficacy Study of Injectable Cabotegravir compared to Daily Oral Tenofovir Disoproxil Fumarate/Emtricitabine (TDF/FTC), For Pre-Exposure Prophylaxis in HIV-Uninfected Cisgender Men and Transgender Women who have sex with Men.  Medication: Investigational Injectable Cabotegravir/placebo compared to Truvada/placebo. Duration: Around 4 years.  Bryan Fox is here for week 83 visit. Denied any injection site reaction. No new problems or medications. Rapid HIV non-reactive. Next study visit scheduled for 06/15/17.

## 2017-05-05 LAB — HIV ANTIBODY (ROUTINE TESTING W REFLEX): HIV 1&2 Ab, 4th Generation: NONREACTIVE

## 2017-06-15 ENCOUNTER — Encounter (INDEPENDENT_AMBULATORY_CARE_PROVIDER_SITE_OTHER): Payer: Self-pay | Admitting: *Deleted

## 2017-06-15 VITALS — BP 137/89 | HR 76 | Temp 97.9°F | Wt 158.5 lb

## 2017-06-15 DIAGNOSIS — Z006 Encounter for examination for normal comparison and control in clinical research program: Secondary | ICD-10-CM

## 2017-06-15 NOTE — Progress Notes (Signed)
Bryan Fox is here for his week 7883 HPTN visit. Notified study team that he would be moving out of [place] in a few weeks. We discussed possible options regarding the study. Not interested in transferring to another study site. 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 Oral medication was not dispensed and injection was not given at today's visit. Returned oral study medication, #33 making his adherence 100%. Rapid HIV non-reactive.

## 2017-06-16 LAB — CBC WITH DIFFERENTIAL/PLATELET
BASOS PCT: 0.9 %
Basophils Absolute: 52 cells/uL (ref 0–200)
EOS PCT: 2.1 %
Eosinophils Absolute: 122 cells/uL (ref 15–500)
HEMATOCRIT: 45 % (ref 38.5–50.0)
HEMOGLOBIN: 15.3 g/dL (ref 13.2–17.1)
LYMPHS ABS: 3057 {cells}/uL (ref 850–3900)
MCH: 30.1 pg (ref 27.0–33.0)
MCHC: 34 g/dL (ref 32.0–36.0)
MCV: 88.6 fL (ref 80.0–100.0)
MPV: 10.4 fL (ref 7.5–12.5)
Monocytes Relative: 7.1 %
NEUTROS ABS: 2158 {cells}/uL (ref 1500–7800)
NEUTROS PCT: 37.2 %
Platelets: 222 10*3/uL (ref 140–400)
RBC: 5.08 10*6/uL (ref 4.20–5.80)
RDW: 13.8 % (ref 11.0–15.0)
Total Lymphocyte: 52.7 %
WBC: 5.8 10*3/uL (ref 3.8–10.8)
WBCMIX: 412 {cells}/uL (ref 200–950)

## 2017-06-16 LAB — PHOSPHORUS: PHOSPHORUS: 3.7 mg/dL (ref 2.5–4.5)

## 2017-06-16 LAB — COMPREHENSIVE METABOLIC PANEL
AG RATIO: 1.8 (calc) (ref 1.0–2.5)
ALT: 23 U/L (ref 9–46)
AST: 24 U/L (ref 10–40)
Albumin: 4.7 g/dL (ref 3.6–5.1)
Alkaline phosphatase (APISO): 24 U/L — ABNORMAL LOW (ref 40–115)
BILIRUBIN TOTAL: 0.3 mg/dL (ref 0.2–1.2)
BUN: 18 mg/dL (ref 7–25)
CALCIUM: 9.8 mg/dL (ref 8.6–10.3)
CHLORIDE: 101 mmol/L (ref 98–110)
CO2: 28 mmol/L (ref 20–32)
Creat: 0.66 mg/dL (ref 0.60–1.35)
GLOBULIN: 2.6 g/dL (ref 1.9–3.7)
GLUCOSE: 97 mg/dL (ref 65–99)
Potassium: 4.3 mmol/L (ref 3.5–5.3)
Sodium: 137 mmol/L (ref 135–146)
Total Protein: 7.3 g/dL (ref 6.1–8.1)

## 2017-06-16 LAB — LIPASE: LIPASE: 15 U/L (ref 7–60)

## 2017-06-16 LAB — CK: CK TOTAL: 115 U/L (ref 44–196)

## 2017-06-16 LAB — AMYLASE: Amylase: 34 U/L (ref 21–101)

## 2017-06-16 LAB — HIV ANTIBODY (ROUTINE TESTING W REFLEX): HIV 1&2 Ab, 4th Generation: NONREACTIVE

## 2017-11-29 ENCOUNTER — Other Ambulatory Visit: Payer: Self-pay | Admitting: Family Medicine

## 2017-11-29 DIAGNOSIS — R911 Solitary pulmonary nodule: Secondary | ICD-10-CM

## 2017-12-06 ENCOUNTER — Ambulatory Visit
Admission: RE | Admit: 2017-12-06 | Discharge: 2017-12-06 | Disposition: A | Discharge status: Home / Self Care | Payer: PRIVATE HEALTH INSURANCE | Source: Ambulatory Visit | Attending: Family Medicine | Admitting: Family Medicine

## 2017-12-06 DIAGNOSIS — R911 Solitary pulmonary nodule: Secondary | ICD-10-CM

## 2019-07-07 ENCOUNTER — Other Ambulatory Visit: Payer: Self-pay

## 2019-07-07 ENCOUNTER — Ambulatory Visit
Admission: RE | Admit: 2019-07-07 | Discharge: 2019-07-07 | Disposition: A | Discharge status: Home / Self Care | Payer: 59 | Source: Ambulatory Visit | Attending: Family Medicine | Admitting: Family Medicine

## 2019-07-07 ENCOUNTER — Other Ambulatory Visit: Payer: Self-pay | Admitting: Family Medicine

## 2019-07-07 DIAGNOSIS — R52 Pain, unspecified: Secondary | ICD-10-CM

## 2020-12-04 ENCOUNTER — Other Ambulatory Visit: Payer: Self-pay

## 2020-12-04 ENCOUNTER — Other Ambulatory Visit: Payer: Self-pay | Admitting: Family Medicine

## 2020-12-04 ENCOUNTER — Ambulatory Visit
Admission: RE | Admit: 2020-12-04 | Discharge: 2020-12-04 | Disposition: A | Discharge status: Home / Self Care | Payer: 59 | Source: Ambulatory Visit | Attending: Family Medicine | Admitting: Family Medicine

## 2020-12-04 DIAGNOSIS — R61 Generalized hyperhidrosis: Secondary | ICD-10-CM

## 2022-07-23 IMAGING — CR DG CHEST 2V
2 series · 2 of 2 positions shown · non-contrast
Comparison: CT chest 12/06/2017; X-ray chest 03/01/2017.

CLINICAL DATA: Night sweats for 6 days.

EXAM:
CHEST - 2 VIEW

[w chest pa]
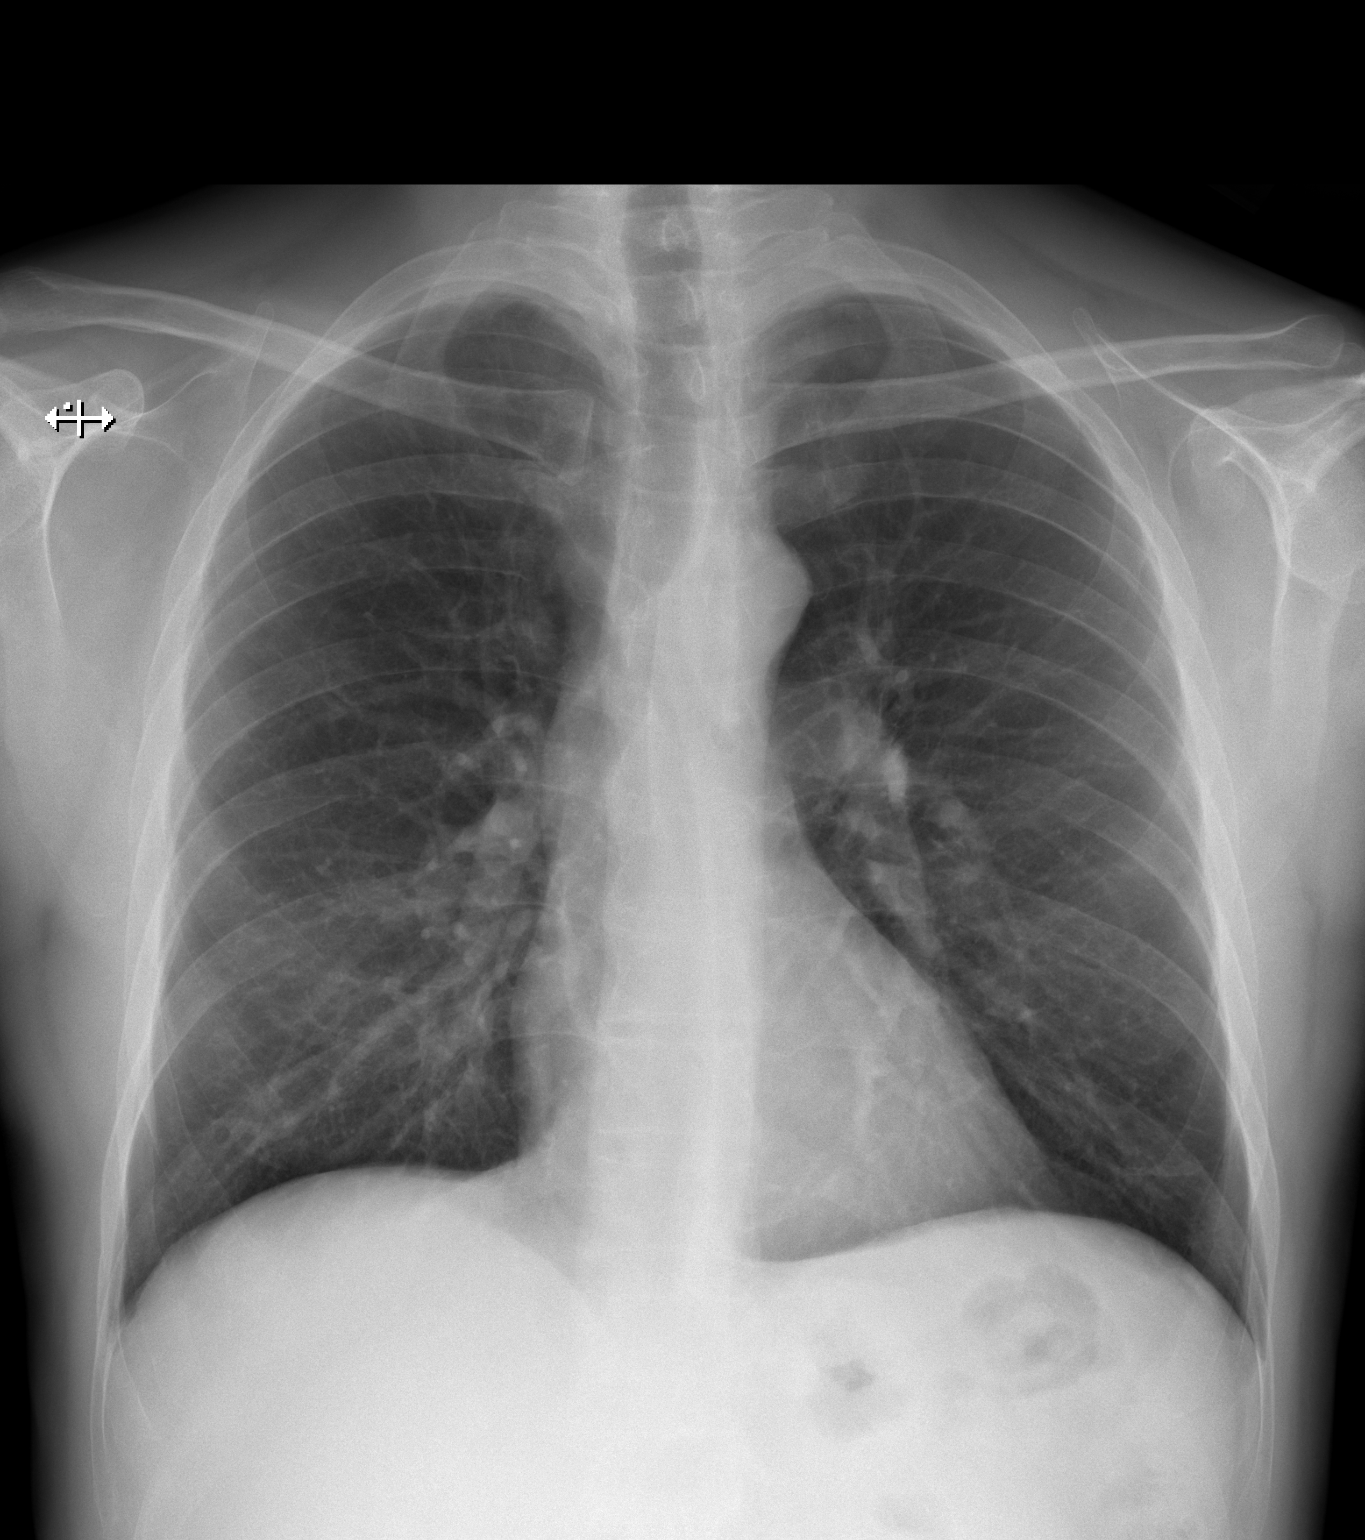

[w chest lat]
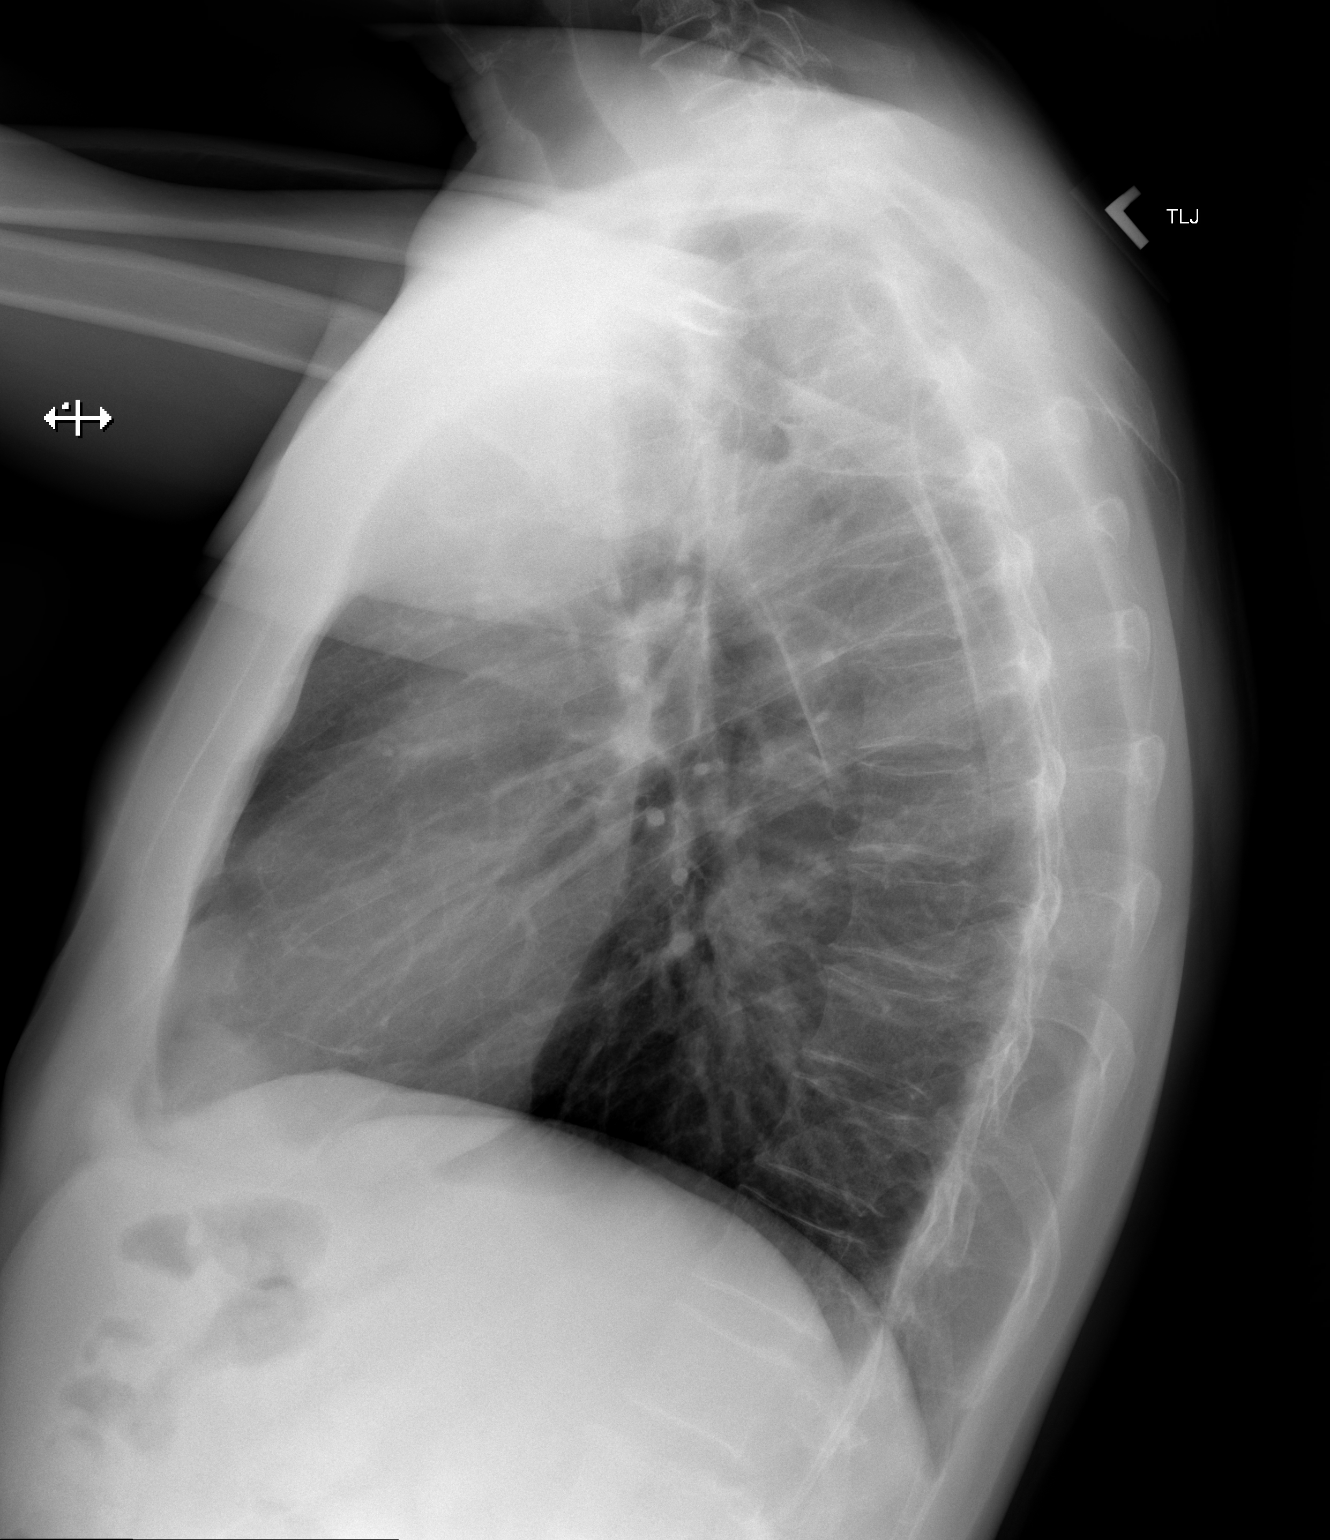

[2 of 2 positions shown; findings below may reference images not displayed]

FINDINGS: The heart size and mediastinal contours are within normal limits.
Both lungs are clear. The visualized skeletal structures are
unremarkable.
IMPRESSION: No active cardiopulmonary disease.

## 2023-09-02 ENCOUNTER — Encounter: Payer: Self-pay | Admitting: Family Medicine

## 2023-09-02 ENCOUNTER — Other Ambulatory Visit: Payer: Self-pay | Admitting: Family Medicine

## 2023-09-02 DIAGNOSIS — R109 Unspecified abdominal pain: Secondary | ICD-10-CM

## 2023-09-09 ENCOUNTER — Inpatient Hospital Stay
Admission: RE | Admit: 2023-09-09 | Discharge: 2023-09-09 | Discharge status: Home / Self Care | Source: Ambulatory Visit | Attending: Family Medicine | Admitting: Family Medicine

## 2023-09-09 DIAGNOSIS — R109 Unspecified abdominal pain: Secondary | ICD-10-CM

## 2023-09-21 ENCOUNTER — Other Ambulatory Visit: Payer: Self-pay | Admitting: Family Medicine

## 2023-09-21 DIAGNOSIS — I77810 Thoracic aortic ectasia: Secondary | ICD-10-CM

## 2023-09-23 ENCOUNTER — Ambulatory Visit
Admission: RE | Admit: 2023-09-23 | Discharge: 2023-09-23 | Disposition: A | Discharge status: Home / Self Care | Source: Ambulatory Visit | Attending: Family Medicine | Admitting: Family Medicine

## 2023-09-23 DIAGNOSIS — I77810 Thoracic aortic ectasia: Secondary | ICD-10-CM

## 2023-09-23 MED ORDER — IOPAMIDOL (ISOVUE-370) INJECTION 76%
77.0000 mL | Freq: Once | INTRAVENOUS | Status: AC | PRN
  Administered 2023-09-23: 77 mL via INTRAVENOUS
